# Patient Record
Sex: Male | Born: 1985 | Race: Black or African American | Hispanic: No | Marital: Single | Smoking: Never smoker
Health system: Southern US, Community
[De-identification: ages and names within clinical notes are randomized; demographics above are authoritative.]

---

## 2010-10-09 ENCOUNTER — Emergency Department: Payer: Self-pay | Admitting: Unknown Physician Specialty

## 2014-12-29 ENCOUNTER — Encounter (HOSPITAL_COMMUNITY): Payer: Self-pay

## 2014-12-29 ENCOUNTER — Emergency Department (HOSPITAL_COMMUNITY): Payer: Self-pay

## 2014-12-29 ENCOUNTER — Emergency Department (HOSPITAL_COMMUNITY)
Admission: EM | Admit: 2014-12-29 | Discharge: 2014-12-29 | Disposition: A | Discharge status: Home / Self Care | Payer: Self-pay | Attending: Emergency Medicine | Admitting: Emergency Medicine

## 2014-12-29 DIAGNOSIS — N390 Urinary tract infection, site not specified: Secondary | ICD-10-CM | POA: Insufficient documentation

## 2014-12-29 DIAGNOSIS — R809 Proteinuria, unspecified: Secondary | ICD-10-CM | POA: Insufficient documentation

## 2014-12-29 DIAGNOSIS — Z87891 Personal history of nicotine dependence: Secondary | ICD-10-CM | POA: Insufficient documentation

## 2014-12-29 DIAGNOSIS — R197 Diarrhea, unspecified: Secondary | ICD-10-CM | POA: Insufficient documentation

## 2014-12-29 DIAGNOSIS — N200 Calculus of kidney: Secondary | ICD-10-CM | POA: Insufficient documentation

## 2014-12-29 LAB — URINE MICROSCOPIC-ADD ON

## 2014-12-29 LAB — URINALYSIS, ROUTINE W REFLEX MICROSCOPIC
Bilirubin Urine: NEGATIVE
GLUCOSE, UA: NEGATIVE mg/dL
Ketones, ur: NEGATIVE mg/dL
Nitrite: NEGATIVE
PH: 6 (ref 5.0–8.0)
Protein, ur: 30 mg/dL — AB
SPECIFIC GRAVITY, URINE: 1.024 (ref 1.005–1.030)
Urobilinogen, UA: 1 mg/dL (ref 0.0–1.0)

## 2014-12-29 LAB — CBC
HCT: 40.8 % (ref 39.0–52.0)
Hemoglobin: 13.9 g/dL (ref 13.0–17.0)
MCH: 29.6 pg (ref 26.0–34.0)
MCHC: 34.1 g/dL (ref 30.0–36.0)
MCV: 86.8 fL (ref 78.0–100.0)
Platelets: 161 K/uL (ref 150–400)
RBC: 4.7 MIL/uL (ref 4.22–5.81)
RDW: 12.3 % (ref 11.5–15.5)
WBC: 4.7 K/uL (ref 4.0–10.5)

## 2014-12-29 LAB — COMPREHENSIVE METABOLIC PANEL
ALBUMIN: 4.1 g/dL (ref 3.5–5.0)
ALK PHOS: 60 U/L (ref 38–126)
ALT: 40 U/L (ref 17–63)
ANION GAP: 8 (ref 5–15)
AST: 27 U/L (ref 15–41)
BUN: 13 mg/dL (ref 6–20)
CALCIUM: 9.1 mg/dL (ref 8.9–10.3)
CO2: 26 mmol/L (ref 22–32)
Chloride: 108 mmol/L (ref 101–111)
Creatinine, Ser: 1.44 mg/dL — ABNORMAL HIGH (ref 0.61–1.24)
GFR calc Af Amer: >60 mL/min (ref >60)
GFR calc non Af Amer: >60 mL/min (ref >60)
GLUCOSE: 112 mg/dL — AB (ref 65–99)
Potassium: 3.7 mmol/L (ref 3.5–5.1)
SODIUM: 142 mmol/L (ref 135–145)
Total Bilirubin: 0.4 mg/dL (ref 0.3–1.2)
Total Protein: 7.2 g/dL (ref 6.5–8.1)

## 2014-12-29 LAB — LIPASE, BLOOD: Lipase: 32 U/L (ref 22–51)

## 2014-12-29 MED ORDER — OXYCODONE-ACETAMINOPHEN 7.5-325 MG PO TABS: 1.0000 | ORAL_TABLET | ORAL | Status: AC | PRN

## 2014-12-29 MED ORDER — SODIUM CHLORIDE 0.9 % IV SOLN
INTRAVENOUS | Status: DC
  Administered 2014-12-29: 08:00:00 via INTRAVENOUS

## 2014-12-29 MED ORDER — ONDANSETRON HCL 4 MG/2ML IJ SOLN: 4.0000 mg | Freq: Once | INTRAMUSCULAR | Status: DC | PRN

## 2014-12-29 MED ORDER — TAMSULOSIN HCL 0.4 MG PO CAPS: 0.4000 mg | ORAL_CAPSULE | Freq: Every day | ORAL | Status: AC

## 2014-12-29 MED ORDER — FENTANYL CITRATE (PF) 100 MCG/2ML IJ SOLN
50.0000 ug | Freq: Once | INTRAMUSCULAR | Status: AC
  Administered 2014-12-29: 50 ug via INTRAVENOUS
  Filled 2014-12-29: qty 2

## 2014-12-29 MED ORDER — CEPHALEXIN 500 MG PO CAPS: 500.0000 mg | ORAL_CAPSULE | Freq: Four times a day (QID) | ORAL | Status: AC

## 2014-12-29 MED ORDER — HYDROMORPHONE HCL 1 MG/ML IJ SOLN
1.0000 mg | Freq: Once | INTRAMUSCULAR | Status: AC
  Administered 2014-12-29: 1 mg via INTRAVENOUS
  Filled 2014-12-29: qty 1

## 2014-12-29 MED ORDER — ONDANSETRON HCL 4 MG/2ML IJ SOLN
4.0000 mg | Freq: Once | INTRAMUSCULAR | Status: AC
  Administered 2014-12-29: 4 mg via INTRAVENOUS
  Filled 2014-12-29: qty 2

## 2014-12-29 NOTE — ED Notes (Signed)
Pt transported to CT with Rad Tech.

## 2014-12-29 NOTE — ED Notes (Signed)
Patient reports loer left abdominal pain since yesterday AM, states intermittent until 0530 this AM, states pain is now constant and sharp.  Last BM yesterday and WNL's for patient.  Patient denies nausea and vomiting

## 2014-12-29 NOTE — Discharge Instructions (Signed)
Proteinuria Proteinuria is a condition in which urine contains more protein than is normal. Proteinuria is either a sign that your body is producing too much protein or a sign that there is a problem with the kidneys. Healthy kidneys prevent most substances that the body needs, including proteins, from leaving the bloodstream and ending up in urine. CAUSES  Proteinuria may be caused by a temporary event or condition such as stress, exercise, or fever, and go away on its own. Proteinuria may also be a symptom of a more serious condition or disease. Causes of proteinuria include:  A kidney disease caused by:  Diabetes.  High blood pressure (hypertension).   A disease that affects the immune system, such as lupus.  A genetic disease, such as Alport's syndrome.  Medicines that damage the kidneys, such as long-term nonsteroidal anti-inflammatory drugs (NSAIDs).  Poisoning or exposure to toxic substances.  A reoccurring kidney or urinary infection.  Excess protein production in the body caused by:  Multiple myeloma.  Amyloidosis. SYMPTOMS You may have proteinuria without having noticeable symptoms. If there is a large amount of protein in your urine, your urine may look foamy. You may also notice swelling (edema) in your hands, feet, abdomen, or face. DIAGNOSIS To determine whether you have proteinuria, you will need to provide a urine sample. Your urine will then be tested for too much protein and the main blood protein albumin. If your test shows that you have proteinuria, you may need to take additional tests to determine its cause, how much protein is in your urine, and what type of protein is being lost. Tests may include:  Blood tests.  Urine tests.  A blood pressure measurement.  Imaging tests. TREATMENT  Treatment will depend on the cause of your proteinuria. Your caregiver will discuss treatment options with you after you have been diagnosed. If your proteinuria is mild or  temporary, no treatment may be necessary. HOME CARE INSTRUCTIONS Ask your caregiver if monitoring the level of protein in your urine at home using simple testing strips is appropriate for you. Early detection of proteinuria can lead to early and often successful treatment of the condition causing it. Document Released: 06/06/2005 Document Revised: 01/09/2012 Document Reviewed: 09/14/2011 [facility] Patient Information 2015 [place], [place]. This information is not intended to replace advice given to you by your health care provider. Make sure you discuss any questions you have with your health care provider. Kidney Stones Kidney stones (urolithiasis) are deposits that form inside your kidneys. The intense pain is caused by the stone moving through the urinary tract. When the stone moves, the ureter goes into spasm around the stone. The stone is usually passed in the urine.  CAUSES   A disorder that makes certain neck glands produce too much parathyroid hormone (primary hyperparathyroidism).  A buildup of uric acid crystals, similar to gout in your joints.  Narrowing (stricture) of the ureter.  A kidney obstruction present at birth (congenital obstruction).  Previous surgery on the kidney or ureters.  Numerous kidney infections. SYMPTOMS   Feeling sick to your stomach (nauseous).  Throwing up (vomiting).  Blood in the urine (hematuria).  Pain that usually spreads (radiates) to the groin.  Frequency or urgency of urination. DIAGNOSIS   Taking a history and physical exam.  Blood or urine tests.  CT scan.  Occasionally, an examination of the inside of the urinary bladder (cystoscopy) is performed. TREATMENT   Observation.  Increasing your fluid intake.  Extracorporeal shock wave lithotripsy--This is a  noninvasive procedure that uses shock waves to break up kidney stones.  Surgery may be needed if you have severe pain or persistent obstruction. There are various surgical  procedures. Most of the procedures are performed with the use of small instruments. Only small incisions are needed to accommodate these instruments, so recovery time is minimized. The size, location, and chemical composition are all important variables that will determine the proper choice of action for you. Talk to your health care provider to better understand your situation so that you will minimize the risk of injury to yourself and your kidney.  HOME CARE INSTRUCTIONS   Drink enough water and fluids to keep your urine clear or pale yellow. This will help you to pass the stone or stone fragments.  Strain all urine through the provided strainer. Keep all particulate matter and stones for your health care provider to see. The stone causing the pain may be as small as a grain of salt. It is very important to use the strainer each and every time you pass your urine. The collection of your stone will allow your health care provider to analyze it and verify that a stone has actually passed. The stone analysis will often identify what you can do to reduce the incidence of recurrences.  Only take over-the-counter or prescription medicines for pain, discomfort, or fever as directed by your health care provider.  Make a follow-up appointment with your health care provider as directed.  Get follow-up X-rays if required. The absence of pain does not always mean that the stone has passed. It may have only stopped moving. If the urine remains completely obstructed, it can cause loss of kidney function or even complete destruction of the kidney. It is your responsibility to make sure X-rays and follow-ups are completed. Ultrasounds of the kidney can show blockages and the status of the kidney. Ultrasounds are not associated with any radiation and can be performed easily in a matter of minutes. SEEK MEDICAL CARE IF:  You experience pain that is progressive and unresponsive to any pain medicine you have been  prescribed. SEEK IMMEDIATE MEDICAL CARE IF:   Pain cannot be controlled with the prescribed medicine.  You have a fever or shaking chills.  The severity or intensity of pain increases over 18 hours and is not relieved by pain medicine.  You develop a new onset of abdominal pain.  You feel faint or pass out.  You are unable to urinate. MAKE SURE YOU:   Understand these instructions.  Will watch your condition.  Will get help right away if you are not doing well or get worse. Document Released: 04/16/2005 Document Revised: 12/17/2012 Document Reviewed: 09/17/2012 [facility] Patient Information 2015 [place], [place]. This information is not intended to replace advice given to you by your health care provider. Make sure you discuss any questions you have with your health care provider. Urinary Tract Infection Urinary tract infections (UTIs) can develop anywhere along your urinary tract. Your urinary tract is your body's drainage system for removing wastes and extra water. Your urinary tract includes two kidneys, two ureters, a bladder, and a urethra. Your kidneys are a pair of bean-shaped organs. Each kidney is about the size of your fist. They are located below your ribs, one on each side of your spine. CAUSES Infections are caused by microbes, which are microscopic organisms, including fungi, viruses, and bacteria. These organisms are so small that they can only be seen through a microscope. Bacteria are the microbes that  most commonly cause UTIs. SYMPTOMS  Symptoms of UTIs may vary by age and gender of the patient and by the location of the infection. Symptoms in young women typically include a frequent and intense urge to urinate and a painful, burning feeling in the bladder or urethra during urination. Older women and men are more likely to be tired, shaky, and weak and have muscle aches and abdominal pain. A fever may mean the infection is in your kidneys. Other symptoms of a kidney  infection include pain in your back or sides below the ribs, nausea, and vomiting. DIAGNOSIS To diagnose a UTI, your caregiver will ask you about your symptoms. Your caregiver also will ask to provide a urine sample. The urine sample will be tested for bacteria and white blood cells. White blood cells are made by your body to help fight infection. TREATMENT  Typically, UTIs can be treated with medication. Because most UTIs are caused by a bacterial infection, they usually can be treated with the use of antibiotics. The choice of antibiotic and length of treatment depend on your symptoms and the type of bacteria causing your infection. HOME CARE INSTRUCTIONS  If you were prescribed antibiotics, take them exactly as your caregiver instructs you. Finish the medication even if you feel better after you have only taken some of the medication.  Drink enough water and fluids to keep your urine clear or pale yellow.  Avoid caffeine, tea, and carbonated beverages. They tend to irritate your bladder.  Empty your bladder often. Avoid holding urine for long periods of time.  Empty your bladder before and after sexual intercourse.  After a bowel movement, women should cleanse from front to back. Use each tissue only once. SEEK MEDICAL CARE IF:   You have back pain.  You develop a fever.  Your symptoms do not begin to resolve within 3 days. SEEK IMMEDIATE MEDICAL CARE IF:   You have severe back pain or lower abdominal pain.  You develop chills.  You have nausea or vomiting.  You have continued burning or discomfort with urination. MAKE SURE YOU:   Understand these instructions.  Will watch your condition.  Will get help right away if you are not doing well or get worse. Document Released: 01/24/2005 Document Revised: 10/16/2011 Document Reviewed: 05/25/2011 [facility] Patient Information 2015 [place], [place]. This information is not intended to replace advice given to you by your health  care provider. Make sure you discuss any questions you have with your health care provider.

## 2014-12-29 NOTE — ED Provider Notes (Signed)
CSN: [id number]     Arrival date & time 12/29/14  0650 History   First MD Initiated Contact with Patient 12/29/14 0715     Chief Complaint  Patient presents with  . Abdominal Pain     (Consider location/radiation/quality/duration/timing/severity/associated sxs/prior Treatment) Patient is a 29 y.o. male presenting with abdominal pain. The history is provided by the patient.  Abdominal Pain Pain location:  L flank Pain quality: sharp   Pain radiates to:  LLQ Pain severity:  Moderate Onset quality:  Sudden Duration:  1 day Timing:  Intermittent Progression:  Worsening Chronicity:  New Relieved by:  Nothing Worsened by:  Nothing tried Ineffective treatments:  None tried Associated symptoms: diarrhea, nausea and vomiting   Associated symptoms: no fever     History reviewed. No pertinent past medical history. History reviewed. No pertinent past surgical history. No family history on file. Social History  Substance Use Topics  . Smoking status: Former Games developer  . Smokeless tobacco: None  . Alcohol Use: Yes     Comment: once a month    Review of Systems  Constitutional: Negative for fever.  Gastrointestinal: Positive for nausea, vomiting, abdominal pain and diarrhea.  All other systems reviewed and are negative.     Allergies  Review of patient's allergies indicates no known allergies.  Home Medications   Prior to Admission medications   Not on File   BP 148/76 mmHg  Pulse 57  Temp(Src) 98 F (36.7 C) (Oral)  Resp 20  Ht  (1.854 m)  Wt 220 lb (99.791 kg)  BMI 29.03 kg/m2  SpO2 100% Physical Exam  Constitutional: He is oriented to person, place, and time. He appears well-developed and well-nourished.  Non-toxic appearance. No distress.  HENT:  Head: Normocephalic and atraumatic.  Eyes: Conjunctivae, EOM and lids are normal. Pupils are equal, round, and reactive to light.  Neck: Normal range of motion. Neck supple. No tracheal deviation present. No  thyroid mass present.  Cardiovascular: Normal rate, regular rhythm and normal heart sounds.  Exam reveals no gallop.   No murmur heard. Pulmonary/Chest: Effort normal and breath sounds normal. No stridor. No respiratory distress. He has no decreased breath sounds. He has no wheezes. He has no rhonchi. He has no rales.  Abdominal: Soft. Normal appearance and bowel sounds are normal. He exhibits no distension. There is no tenderness. There is no rebound and no CVA tenderness.    Musculoskeletal: Normal range of motion. He exhibits no edema or tenderness.  Neurological: He is alert and oriented to person, place, and time. He has normal strength. No cranial nerve deficit or sensory deficit. GCS eye subscore is 4. GCS verbal subscore is 5. GCS motor subscore is 6.  Skin: Skin is warm and dry. No abrasion and no rash noted.  Psychiatric: He has a normal mood and affect. His speech is normal and behavior is normal.  Nursing note and vitals reviewed.   ED Course  Procedures (including critical care time) Labs Review Labs Reviewed  CBC  LIPASE, BLOOD  COMPREHENSIVE METABOLIC PANEL  URINALYSIS, ROUTINE W REFLEX MICROSCOPIC (NOT AT [facility])    Imaging Review No results found. I have personally reviewed and evaluated these images and lab results as part of my medical decision-making.   EKG Interpretation None      MDM   Final diagnoses:  None    Patient given pain medication here feels better. Patient's elevated creatinine noted and likely from dehydration. Results of CT scan discussed with him  and will be given referral to urology. He was instructed to take more liquids. Blood pressure noted and patient will be informed that he is to follow-up with his doctor for that as he has protein in his urine. Patient also with 3-6 white cells and will place on antibiotics    Lorre Nick, MD 12/29/14 [phone]

## 2014-12-29 NOTE — ED Notes (Signed)
Pt. Unable to urinate at this time. Pt, aware of giving a urine specimen. Nurse was notified.

## 2017-06-24 ENCOUNTER — Emergency Department (HOSPITAL_COMMUNITY)
Admission: EM | Admit: 2017-06-24 | Discharge: 2017-06-24 | Disposition: A | Discharge status: Home / Self Care | Payer: Self-pay | Attending: Emergency Medicine | Admitting: Emergency Medicine

## 2017-06-24 ENCOUNTER — Encounter (HOSPITAL_COMMUNITY): Payer: Self-pay

## 2017-06-24 ENCOUNTER — Other Ambulatory Visit: Payer: Self-pay

## 2017-06-24 ENCOUNTER — Emergency Department (HOSPITAL_BASED_OUTPATIENT_CLINIC_OR_DEPARTMENT_OTHER): Admit: 2017-06-24 | Discharge: 2017-06-24 | Disposition: A | Discharge status: Home / Self Care | Payer: Self-pay

## 2017-06-24 DIAGNOSIS — M7989 Other specified soft tissue disorders: Secondary | ICD-10-CM

## 2017-06-24 DIAGNOSIS — M79601 Pain in right arm: Secondary | ICD-10-CM

## 2017-06-24 DIAGNOSIS — R2231 Localized swelling, mass and lump, right upper limb: Secondary | ICD-10-CM | POA: Insufficient documentation

## 2017-06-24 DIAGNOSIS — Z79899 Other long term (current) drug therapy: Secondary | ICD-10-CM | POA: Insufficient documentation

## 2017-06-24 DIAGNOSIS — M79609 Pain in unspecified limb: Secondary | ICD-10-CM

## 2017-06-24 DIAGNOSIS — Z87891 Personal history of nicotine dependence: Secondary | ICD-10-CM | POA: Insufficient documentation

## 2017-06-24 NOTE — Progress Notes (Signed)
Preliminary notes by tech--Rt upper extremity venous duplex exam completed. Negative for DVT.  Hongying Paxten Appelt(RDMS, RVT)

## 2017-06-24 NOTE — ED Provider Notes (Signed)
[facility] Note   CSN: [id number] Arrival date & time: 06/24/17  0808     History   Chief Complaint Chief Complaint  Patient presents with  . Arm pain    HPI Jonathon DeitersJoshua A Hoadley is a 32 y.o. male.  HPI 32 year old male presents emergency department because of swelling and bruising of his right antecubital fossa in the right upper extremity.  He states he donated plasma 1 week ago and has been having swelling and pain there since.  No history of DVT.  No chest pain or shortness of breath.  Denies numbness or weakness of his right upper extremity.  He states he has difficulty straightening his right arm all the way secondary to the bruising and swelling.  Symptoms are moderate in severity.   History reviewed. No pertinent past medical history.  There are no active problems to display for this patient.   History reviewed. No pertinent surgical history.     Home Medications    Prior to Admission medications   Medication Sig Start Date End Date Taking? Authorizing Provider  aspirin-acetaminophen-caffeine (EXCEDRIN MIGRAINE) 940 [id number] [id number] MG per tablet Take 2 tablets by mouth every 6 (six) hours as needed for headache or migraine.    [provider]  cephALEXin (KEFLEX) 500 MG capsule Take 1 capsule (500 mg total) by mouth 4 (four) times daily. 12/29/14   Lorre NickAllen, Anthony, MD  oxyCODONE-acetaminophen (PERCOCET) 7.5-325 MG per tablet Take 1 tablet by mouth every 4 (four) hours as needed for severe pain. 12/29/14   Lorre NickAllen, Anthony, MD  tamsulosin (FLOMAX) 0.4 MG CAPS capsule Take 1 capsule (0.4 mg total) by mouth daily. 12/29/14   Lorre NickAllen, Anthony, MD    Family History No family history on file.  Social History Social History   Tobacco Use  . Smoking status: Former Smoker  Substance Use Topics  . Alcohol use: Yes    Comment: once a month  . Drug use: No     Allergies   Patient has no known allergies.   Review of  Systems Review of Systems  All other systems reviewed and are negative.    Physical Exam Updated Vital Signs BP 118/69   Pulse 75   Temp 98.4 F (36.9 C) (Oral)   Resp 17   Ht 6\' 1"  (1.854 m)   Wt 88 kg (194 lb)   SpO2 98%   BMI 25.60 kg/m   Physical Exam  Constitutional: He is oriented to person, place, and time. He appears well-developed and well-nourished.  HENT:  Head: Normocephalic.  Eyes: EOM are normal.  Neck: Normal range of motion.  Pulmonary/Chest: Effort normal.  Abdominal: He exhibits no distension.  Musculoskeletal:  Mild swelling of the right upper extremity as compared to the left.  Normal grip right hand.  Normal right radial pulse.  Bruising on the medial aspect of the proximal right forearm and distal right upper arm.  Normal pronation and supination.  No erythema or warmth  Neurological: He is alert and oriented to person, place, and time.  Psychiatric: He has a normal mood and affect.  Nursing note and vitals reviewed.    ED Treatments / Results  Labs (all labs ordered are listed, but only abnormal results are displayed) Labs Reviewed - No data to display  EKG  EKG Interpretation None       Radiology No results found.  Procedures Procedures (including critical care time)  Medications Ordered in ED Medications - No data to display  Initial Impression / Assessment and Plan / ED Course  I have reviewed the triage vital signs and the nursing notes.  Pertinent labs & imaging results that were available during my care of the patient were reviewed by me and considered in my medical decision making (see chart for details).     Venous duplex negative for DVT.  Normal right radial pulse.  Likely bruising associated with some minor venous extravasation.  Overall well-appearing.  Discharged home in good condition.  Compartments are soft.  Final Clinical Impressions(s) / ED Diagnoses   Final diagnoses:  Pain and swelling of right upper  extremity    ED Discharge Orders    None       Azalia Bilis, MD 06/24/17 [phone]

## 2017-06-24 NOTE — Discharge Instructions (Signed)
Elevate  your arm.

## 2017-06-24 NOTE — ED Triage Notes (Signed)
Pt presents to the ed with bruising and tenderness to right arm after donating plasma one week ago.  Pt reports it is getting worse, cms intact.

## 2017-07-11 ENCOUNTER — Encounter (HOSPITAL_COMMUNITY): Payer: Self-pay | Admitting: Emergency Medicine

## 2017-07-11 ENCOUNTER — Emergency Department (HOSPITAL_COMMUNITY): Payer: Self-pay

## 2017-07-11 ENCOUNTER — Other Ambulatory Visit: Payer: Self-pay

## 2017-07-11 ENCOUNTER — Emergency Department (HOSPITAL_COMMUNITY)
Admission: EM | Admit: 2017-07-11 | Discharge: 2017-07-11 | Disposition: A | Discharge status: Home / Self Care | Payer: Self-pay | Attending: Emergency Medicine | Admitting: Emergency Medicine

## 2017-07-11 DIAGNOSIS — J3089 Other allergic rhinitis: Secondary | ICD-10-CM | POA: Insufficient documentation

## 2017-07-11 DIAGNOSIS — Z87891 Personal history of nicotine dependence: Secondary | ICD-10-CM | POA: Insufficient documentation

## 2017-07-11 DIAGNOSIS — Z7982 Long term (current) use of aspirin: Secondary | ICD-10-CM | POA: Insufficient documentation

## 2017-07-11 DIAGNOSIS — R062 Wheezing: Secondary | ICD-10-CM | POA: Insufficient documentation

## 2017-07-11 MED ORDER — ALBUTEROL SULFATE HFA 108 (90 BASE) MCG/ACT IN AERS
2.0000 | INHALATION_SPRAY | Freq: Once | RESPIRATORY_TRACT | Status: AC
Start: 2017-07-11 — End: 2017-07-11
  Administered 2017-07-11: 2 via RESPIRATORY_TRACT
  Filled 2017-07-11: qty 6.7

## 2017-07-11 MED ORDER — CETIRIZINE-PSEUDOEPHEDRINE ER 5-120 MG PO TB12: 1.0000 | ORAL_TABLET | Freq: Two times a day (BID) | ORAL | 0 refills | Status: AC

## 2017-07-11 NOTE — ED Triage Notes (Signed)
Pt c/o of cough and congestion and pain "in lining of stomach".  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

## 2017-07-11 NOTE — Discharge Instructions (Signed)
As discussed I suspect you are having allergy very possibly to your new work enviroment.  You have been prescribed an allergy medicine (although you can buy this over the counter).  Also, use the albuterol inhaler as instructed if you are coughing or wheezing.

## 2017-07-11 NOTE — ED Provider Notes (Signed)
[facility] EMERGENCY DEPARTMENT Provider Note   CSN: [id number] Arrival date & time: 07/11/17  1538     History   Chief Complaint Chief Complaint  Patient presents with  . Cough    HPI Jonathon Bolton is a 32 y.o. male presenting with a one week history of nasal congestion, clear rhinorrhea and cough productive of clear sputum.  He started a new job last week where is lots of floating cotton particles in the air which he believes he is reacting to as he also reports itchy, watery eyes and sneezing.  He wears a mask but not consistently and reports frequently toward the end of his shift he has increased chest tightness and wheezing with sob. He has had no medications prior to arrival.  HPI  History reviewed. No pertinent past medical history.  There are no active problems to display for this patient.   History reviewed. No pertinent surgical history.     Home Medications    Prior to Admission medications   Medication Sig Start Date End Date Taking? Authorizing Provider  aspirin-acetaminophen-caffeine (EXCEDRIN MIGRAINE) [phone] MG per tablet Take 2 tablets by mouth every 6 (six) hours as needed for headache or migraine.    [provider]  cephALEXin (KEFLEX) 500 MG capsule Take 1 capsule (500 mg total) by mouth 4 (four) times daily. 12/29/14   Lorre Nick, MD  cetirizine-pseudoephedrine (ZYRTEC-D) 5-120 MG tablet Take 1 tablet by mouth 2 (two) times daily. 07/11/17   Burgess Amor, PA-C  oxyCODONE-acetaminophen (PERCOCET) 7.5-325 MG per tablet Take 1 tablet by mouth every 4 (four) hours as needed for severe pain. 12/29/14   Lorre Nick, MD  tamsulosin (FLOMAX) 0.4 MG CAPS capsule Take 1 capsule (0.4 mg total) by mouth daily. 12/29/14   Lorre Nick, MD    Family History History reviewed. No pertinent family history.  Social History Social History   Tobacco Use  . Smoking status: Former Games developer  . Smokeless tobacco: Never Used  Substance Use Topics  .  Alcohol use: Yes    Comment: once a month  . Drug use: Yes    Types: Marijuana    Comment: 2 days ago     Allergies   Patient has no known allergies.   Review of Systems Review of Systems  Constitutional: Negative for chills and fever.  HENT: Positive for congestion, postnasal drip, rhinorrhea and sneezing. Negative for ear pain, sinus pressure, sinus pain, sore throat, trouble swallowing and voice change.   Eyes: Negative for discharge.  Respiratory: Positive for cough, chest tightness and wheezing. Negative for shortness of breath and stridor.   Cardiovascular: Negative for chest pain.  Gastrointestinal: Negative for abdominal pain.  Genitourinary: Negative.      Physical Exam Updated Vital Signs BP 119/72 (BP Location: Left Arm)   Pulse 78   Temp 98 F (36.7 C) (Oral)   Resp 16   Ht 6' (1.829 m)   Wt 83.5 kg (184 lb)   SpO2 100%   BMI 24.95 kg/m   Physical Exam  Constitutional: He is oriented to person, place, and time. He appears well-developed and well-nourished.  HENT:  Head: Normocephalic and atraumatic.  Right Ear: Tympanic membrane and ear canal normal.  Left Ear: Tympanic membrane and ear canal normal.  Nose: Rhinorrhea present. No mucosal edema.  Mouth/Throat: Uvula is midline, oropharynx is clear and moist and mucous membranes are normal. No oropharyngeal exudate, posterior oropharyngeal edema, posterior oropharyngeal erythema or tonsillar abscesses.  Eyes: Conjunctivae are  normal.  Cardiovascular: Normal rate and normal heart sounds.  Pulmonary/Chest: Effort normal and breath sounds normal. No respiratory distress. He has no decreased breath sounds. He has no wheezes. He has no rhonchi. He has no rales.  No wheezing at present, lungs ctab  Musculoskeletal: Normal range of motion.  Neurological: He is alert and oriented to person, place, and time.  Skin: Skin is warm and dry. No rash noted.  Psychiatric: He has a normal mood and affect.     ED  Treatments / Results  Labs (all labs ordered are listed, but only abnormal results are displayed) Labs Reviewed - No data to display  EKG  EKG Interpretation None       Radiology Dg Chest 2 View  Result Date: 07/11/2017 CLINICAL DATA:  Cough EXAM: CHEST - 2 VIEW COMPARISON:  None. FINDINGS: Normal heart size. Normal mediastinal contour. No pneumothorax. No pleural effusion. Lungs appear clear, with no acute consolidative airspace disease and no pulmonary edema. IMPRESSION: No active cardiopulmonary disease. Electronically Signed   By: Delbert PhenixJason A Poff M.D.   On: 07/11/2017 16:09    Procedures Procedures (including critical care time)  Medications Ordered in ED Medications  albuterol (PROVENTIL HFA;VENTOLIN HFA) 108 (90 Base) MCG/ACT inhaler 2 puff (not administered)     Initial Impression / Assessment and Plan / ED Course  I have reviewed the triage vital signs and the nursing notes.  Pertinent labs & imaging results that were available during my care of the patient were reviewed by me and considered in my medical decision making (see chart for details).     Pt with history suggesting allergic reaction with wheezing, but is currently asymptomatic.  He was placed on zyrtec, albuterol given.  Referrals given to establish primary care.  Advised he should wear mask at all times at work to avoid inhaling allergen.  Final Clinical Impressions(s) / ED Diagnoses   Final diagnoses:  Allergic rhinitis due to other allergic trigger, unspecified seasonality  Wheezing    ED Discharge Orders        Ordered    cetirizine-pseudoephedrine (ZYRTEC-D) 5-120 MG tablet  2 times daily     07/11/17 1702       Burgess Amordol, Nyheim Seufert, PA-C 07/11/17 1709    Samuel JesterMcManus, Kathleen, DO 07/11/17 2319

## 2019-06-24 ENCOUNTER — Encounter (HOSPITAL_BASED_OUTPATIENT_CLINIC_OR_DEPARTMENT_OTHER): Payer: Self-pay | Admitting: Emergency Medicine

## 2019-06-24 ENCOUNTER — Emergency Department (HOSPITAL_BASED_OUTPATIENT_CLINIC_OR_DEPARTMENT_OTHER): Payer: Self-pay

## 2019-06-24 ENCOUNTER — Other Ambulatory Visit: Payer: Self-pay

## 2019-06-24 ENCOUNTER — Emergency Department (HOSPITAL_BASED_OUTPATIENT_CLINIC_OR_DEPARTMENT_OTHER)
Admission: EM | Admit: 2019-06-24 | Discharge: 2019-06-24 | Disposition: A | Discharge status: Home / Self Care | Payer: Self-pay | Attending: Emergency Medicine | Admitting: Emergency Medicine

## 2019-06-24 DIAGNOSIS — Z20822 Contact with and (suspected) exposure to covid-19: Secondary | ICD-10-CM | POA: Insufficient documentation

## 2019-06-24 DIAGNOSIS — Z79899 Other long term (current) drug therapy: Secondary | ICD-10-CM | POA: Insufficient documentation

## 2019-06-24 DIAGNOSIS — R0602 Shortness of breath: Secondary | ICD-10-CM | POA: Insufficient documentation

## 2019-06-24 DIAGNOSIS — Z87891 Personal history of nicotine dependence: Secondary | ICD-10-CM | POA: Insufficient documentation

## 2019-06-24 DIAGNOSIS — B349 Viral infection, unspecified: Secondary | ICD-10-CM | POA: Insufficient documentation

## 2019-06-24 DIAGNOSIS — R0789 Other chest pain: Secondary | ICD-10-CM | POA: Insufficient documentation

## 2019-06-24 DIAGNOSIS — R070 Pain in throat: Secondary | ICD-10-CM | POA: Insufficient documentation

## 2019-06-24 LAB — CBC WITH DIFFERENTIAL/PLATELET
Abs Immature Granulocytes: 0.01 10*3/uL (ref 0.00–0.07)
Basophils Absolute: 0 10*3/uL (ref 0.0–0.1)
Basophils Relative: 1 %
Eosinophils Absolute: 0.2 10*3/uL (ref 0.0–0.5)
Eosinophils Relative: 4 %
HCT: 45.2 % (ref 39.0–52.0)
Hemoglobin: 14.9 g/dL (ref 13.0–17.0)
Immature Granulocytes: 0 %
Lymphocytes Relative: 44 %
Lymphs Abs: 1.8 10*3/uL (ref 0.7–4.0)
MCH: 30.2 pg (ref 26.0–34.0)
MCHC: 33 g/dL (ref 30.0–36.0)
MCV: 91.5 fL (ref 80.0–100.0)
Monocytes Absolute: 0.5 10*3/uL (ref 0.1–1.0)
Monocytes Relative: 11 %
Neutro Abs: 1.6 10*3/uL — ABNORMAL LOW (ref 1.7–7.7)
Neutrophils Relative %: 40 %
Platelets: 167 10*3/uL (ref 150–400)
RBC: 4.94 MIL/uL (ref 4.22–5.81)
RDW: 11.9 % (ref 11.5–15.5)
WBC: 4 10*3/uL (ref 4.0–10.5)
nRBC: 0 % (ref 0.0–0.2)

## 2019-06-24 LAB — COMPREHENSIVE METABOLIC PANEL
ALT: 37 U/L (ref 0–44)
AST: 31 U/L (ref 15–41)
Albumin: 3.9 g/dL (ref 3.5–5.0)
Alkaline Phosphatase: 52 U/L (ref 38–126)
Anion gap: 5 (ref 5–15)
BUN: 16 mg/dL (ref 6–20)
CO2: 26 mmol/L (ref 22–32)
Calcium: 8.3 mg/dL — ABNORMAL LOW (ref 8.9–10.3)
Chloride: 109 mmol/L (ref 98–111)
Creatinine, Ser: 1.34 mg/dL — ABNORMAL HIGH (ref 0.61–1.24)
GFR calc Af Amer: >60 mL/min (ref >60)
GFR calc non Af Amer: >60 mL/min (ref >60)
Glucose, Bld: 97 mg/dL (ref 70–99)
Potassium: 4.7 mmol/L (ref 3.5–5.1)
Sodium: 140 mmol/L (ref 135–145)
Total Bilirubin: 0.4 mg/dL (ref 0.3–1.2)
Total Protein: 6.7 g/dL (ref 6.5–8.1)

## 2019-06-24 LAB — URINALYSIS, ROUTINE W REFLEX MICROSCOPIC
Bilirubin Urine: NEGATIVE
Glucose, UA: NEGATIVE mg/dL
Hgb urine dipstick: NEGATIVE
Ketones, ur: NEGATIVE mg/dL
Leukocytes,Ua: NEGATIVE
Nitrite: NEGATIVE
Protein, ur: NEGATIVE mg/dL
Specific Gravity, Urine: 1.02 (ref 1.005–1.030)
pH: 7 (ref 5.0–8.0)

## 2019-06-24 LAB — RAPID URINE DRUG SCREEN, HOSP PERFORMED
Amphetamines: NOT DETECTED
Barbiturates: NOT DETECTED
Benzodiazepines: NOT DETECTED
Cocaine: NOT DETECTED
Opiates: NOT DETECTED
Tetrahydrocannabinol: POSITIVE — AB

## 2019-06-24 LAB — SARS CORONAVIRUS 2 (TAT 6-24 HRS): SARS Coronavirus 2: NEGATIVE

## 2019-06-24 LAB — HIV ANTIBODY (ROUTINE TESTING W REFLEX): HIV Screen 4th Generation wRfx: NONREACTIVE

## 2019-06-24 LAB — TROPONIN I (HIGH SENSITIVITY): Troponin I (High Sensitivity): 4 ng/L (ref <18)

## 2019-06-24 LAB — LIPASE, BLOOD: Lipase: 36 U/L (ref 11–51)

## 2019-06-24 LAB — C-REACTIVE PROTEIN: CRP: 0.6 mg/dL (ref <1.0)

## 2019-06-24 LAB — SEDIMENTATION RATE: Sed Rate: 0 mm/hr (ref 0–16)

## 2019-06-24 MED ORDER — BENZONATATE 100 MG PO CAPS: 100.0000 mg | ORAL_CAPSULE | Freq: Three times a day (TID) | ORAL | 0 refills | Status: AC

## 2019-06-24 MED ORDER — ONDANSETRON HCL 4 MG/2ML IJ SOLN
4.0000 mg | Freq: Once | INTRAMUSCULAR | Status: AC
  Administered 2019-06-24: 10:00:00 4 mg via INTRAVENOUS
  Filled 2019-06-24: qty 2

## 2019-06-24 MED ORDER — ONDANSETRON 4 MG PO TBDP: 4.0000 mg | ORAL_TABLET | Freq: Three times a day (TID) | ORAL | 0 refills | Status: DC | PRN

## 2019-06-24 MED ORDER — SODIUM CHLORIDE 0.9 % IV BOLUS
1000.0000 mL | Freq: Once | INTRAVENOUS | Status: AC
  Administered 2019-06-24: 1000 mL via INTRAVENOUS

## 2019-06-24 MED ORDER — DICYCLOMINE HCL 20 MG PO TABS: 20.0000 mg | ORAL_TABLET | Freq: Two times a day (BID) | ORAL | 0 refills | Status: AC

## 2019-06-24 MED ORDER — IBUPROFEN 600 MG PO TABS: 600.0000 mg | ORAL_TABLET | Freq: Three times a day (TID) | ORAL | 0 refills | Status: AC

## 2019-06-24 MED ORDER — KETOROLAC TROMETHAMINE 30 MG/ML IJ SOLN
15.0000 mg | Freq: Once | INTRAMUSCULAR | Status: AC
  Administered 2019-06-24: 15 mg via INTRAVENOUS
  Filled 2019-06-24: qty 1

## 2019-06-24 MED ORDER — FLUTICASONE PROPIONATE 50 MCG/ACT NA SUSP: 2.0000 | Freq: Every day | NASAL | 0 refills | Status: AC

## 2019-06-24 NOTE — ED Triage Notes (Signed)
Pt here with chest discomfort, SOB, sore throat, and abdominal pain x 1 week. Pt states he is concerned about COVID being the cause.

## 2019-06-24 NOTE — Discharge Instructions (Addendum)
General Viral Syndrome Care Instructions:  Your symptoms are likely consistent with a viral illness. Viruses do not require or respond to antibiotics. Treatment is symptomatic care and it is important to note that these symptoms may last for 7-14 days.   Hand washing: Wash your hands throughout the day, but especially before and after touching the face, using the restroom, sneezing, coughing, or touching surfaces that have been coughed or sneezed upon. Hydration: Symptoms of most illnesses will be intensified and complicated by dehydration. Dehydration can also extend the duration of symptoms. Drink plenty of fluids and get plenty of rest. You should be drinking at least half a liter of water an hour to stay hydrated. Electrolyte drinks (ex. Gatorade, Powerade, Pedialyte) are also encouraged. You should be drinking enough fluids to make your urine light yellow, almost clear. If this is not the case, you are not drinking enough water. Please note that some of the treatments indicated below will not be effective if you are not adequately hydrated. Diet: Please concentrate on hydration, however, you may introduce food slowly.  Start with a clear liquid diet, progressed to a full liquid diet, and then bland solids as you are able. Pain or fever: Ibuprofen, Naproxen, or acetaminophen (generic for Tylenol) for pain or fever.  Antiinflammatory medications: Take 600 mg of ibuprofen 3 times a day for the next week. After this time, this medication may be used as needed for pain. Take this medication with food to avoid upset stomach. Acetaminophen (generic for Tylenol): Should you continue to have additional pain while taking the ibuprofen or naproxen, you may add in acetaminophen as needed. Your daily total maximum amount of acetaminophen from all sources should be limited to 4000mg /day for persons without liver problems, or 2000mg /day for those with liver problems. Nausea/vomiting: Use the ondansetron (generic  for Zofran) for nausea or vomiting.  This medication may not prevent all vomiting or nausea, but can help facilitate better hydration. Things that can help with nausea/vomiting also include peppermint/menthol candies, vitamin B12, and ginger. Diarrhea: May use medications such as loperamide (Imodium) or Bismuth subsalicylate (Pepto-Bismol). Dicyclomine: Dicyclomine (generic for Bentyl) is what is known as an antispasmodic and is intended to help reduce abdominal discomfort. Cough: Use the benzonatate (generic for Tessalon) for cough.  Teas, warm liquids, broths, and honey can also help with cough. Albuterol: May use the albuterol as needed for instances of shortness of breath. Zyrtec or Claritin: May add these medication daily to control underlying symptoms of congestion, sneezing, and other signs of allergies.  These medications are available over-the-counter. Generics: Cetirizine (generic for Zyrtec) and loratadine (generic for Claritin). Fluticasone: Use fluticasone (generic for Flonase), as directed, for nasal and sinus congestion.  This medication is available over-the-counter. Congestion: Plain guaifenesin (generic for plain Mucinex) may help relieve congestion. Saline sinus rinses and saline nasal sprays may also help relieve congestion. If you do not have high blood pressure, heart problems, or an allergy to such medications, you may also try phenylephrine or Sudafed. Sore throat: Warm liquids or Chloraseptic spray may help soothe a sore throat. Gargle twice a day with a salt water solution made from a half teaspoon of salt in a cup of warm water.  Follow up: Follow up with a primary care provider within the next two weeks should symptoms fail to resolve. Return: Return to the ED for significantly worsening symptoms, shortness of breath, persistent vomiting, large amounts of blood in stool, or any other major concerns.  For prescription assistance,  may try using prescription discount sites or  apps, such as goodrx.com  Based on the EKG findings, you may have developed pericarditis.  Follow the instructions above for ibuprofen.  Follow-up with cardiology should symptoms fail to resolve.

## 2019-06-24 NOTE — ED Provider Notes (Signed)
[place] EMERGENCY DEPARTMENT Provider Note   CSN: [id number] Arrival date & time: 06/24/19  4287     History Chief Complaint  Patient presents with  . Abdominal Pain  . Sore Throat  . Chest Pain  . Shortness of Breath    Jonathon Bolton is a 34 y.o. male.  HPI      Jonathon Bolton is a 34 y.o. male, patient with no pertinent past medical history, presenting to the ED with concern for COVID-19 infection.  He has experienced almost a week of intermittent chest discomfort, abdominal discomfort, nausea, occasional nonproductive cough, occasional headache, and nasal congestion.  Nonbloody, nonbilious emesis 2 days ago, but none since. Abdominal discomfort is mostly in the epigastric region, intermittent, cramping, mild, comes on mostly with coughing or when he was vomiting. Chest pain is described as a soreness, across the chest, mild, no change with deep breathing or with exertion. He has had contact with somebody later diagnosed with COVID-19.  Denies fever/chills, shortness of breath, diarrhea, urinary symptoms, syncope, dizziness, neck stiffness, lower extremity edema/pain, or any other complaints.    History reviewed. No pertinent past medical history.  There are no problems to display for this patient.   History reviewed. No pertinent surgical history.     History reviewed. No pertinent family history.  Social History   Tobacco Use  . Smoking status: Former Research scientist (life sciences)  . Smokeless tobacco: Never Used  Substance Use Topics  . Alcohol use: Yes    Comment: once a month  . Drug use: Yes    Types: Marijuana    Comment: 2 days ago    Home Medications Prior to Admission medications   Medication Sig Start Date End Date Taking? Authorizing Provider  aspirin-acetaminophen-caffeine (EXCEDRIN MIGRAINE) [phone] MG per tablet Take 2 tablets by mouth every 6 (six) hours as needed for headache or migraine.    [provider]  benzonatate (TESSALON)  100 MG capsule Take 1 capsule (100 mg total) by mouth every 8 (eight) hours. 06/24/19   Alyce Inscore C, PA-C  cephALEXin (KEFLEX) 500 MG capsule Take 1 capsule (500 mg total) by mouth 4 (four) times daily. 12/29/14   Lacretia Leigh, MD  cetirizine-pseudoephedrine (ZYRTEC-D) 5-120 MG tablet Take 1 tablet by mouth 2 (two) times daily. 07/11/17   Evalee Jefferson, PA-C  dicyclomine (BENTYL) 20 MG tablet Take 1 tablet (20 mg total) by mouth 2 (two) times daily. 06/24/19   Jolyn Deshmukh C, PA-C  fluticasone (FLONASE) 50 MCG/ACT nasal spray Place 2 sprays into both nostrils daily. 06/24/19   Amare Bail C, PA-C  ibuprofen (ADVIL) 600 MG tablet Take 1 tablet (600 mg total) by mouth 3 (three) times daily for 7 days. 06/24/19 07/01/19  Emaya Preston C, PA-C  ondansetron (ZOFRAN ODT) 4 MG disintegrating tablet Take 1 tablet (4 mg total) by mouth every 8 (eight) hours as needed for nausea or vomiting. 06/24/19   Saverio Kader C, PA-C  oxyCODONE-acetaminophen (PERCOCET) 7.5-325 MG per tablet Take 1 tablet by mouth every 4 (four) hours as needed for severe pain. 12/29/14   Lacretia Leigh, MD  tamsulosin (FLOMAX) 0.4 MG CAPS capsule Take 1 capsule (0.4 mg total) by mouth daily. 12/29/14   Lacretia Leigh, MD    Allergies    Patient has no known allergies.  Review of Systems   Review of Systems  Constitutional: Negative for chills, diaphoresis and fever.  HENT: Positive for congestion. Negative for trouble swallowing and voice change.   Respiratory:  Positive for cough. Negative for shortness of breath.   Cardiovascular: Positive for chest pain. Negative for leg swelling.  Gastrointestinal: Positive for abdominal pain, nausea and vomiting. Negative for diarrhea.  Genitourinary: Negative for dysuria, flank pain and hematuria.  Musculoskeletal: Negative for back pain and neck pain.  Skin: Negative for rash.  Neurological: Positive for headaches. Negative for dizziness, syncope and weakness.  All other systems reviewed and are  negative.   Physical Exam Updated Vital Signs BP 117/87 (BP Location: Right Arm)   Pulse 78   Temp 98.6 F (37 C) (Oral)   Resp 20   SpO2 100%   Physical Exam Vitals and nursing note reviewed.  Constitutional:      General: He is not in acute distress.    Appearance: He is well-developed. He is not diaphoretic.  HENT:     Head: Normocephalic and atraumatic.     Mouth/Throat:     Mouth: Mucous membranes are moist.     Pharynx: Oropharynx is clear.  Eyes:     Conjunctiva/sclera: Conjunctivae normal.  Cardiovascular:     Rate and Rhythm: Normal rate and regular rhythm.     Pulses: Normal pulses.          Radial pulses are 2+ on the right side and 2+ on the left side.       Posterior tibial pulses are 2+ on the right side and 2+ on the left side.     Heart sounds: Normal heart sounds.     Comments: Tactile temperature in the extremities appropriate and equal bilaterally. No noted friction rub. Pulmonary:     Effort: Pulmonary effort is normal. No respiratory distress.     Breath sounds: Normal breath sounds.     Comments: No increased work of breathing.  Speaks in full sentences without difficulty. Abdominal:     Palpations: Abdomen is soft.     Tenderness: There is no guarding.     Comments: Patient verbally indicates mild abdominal tenderness in the epigastric region, but has no other reaction.  Musculoskeletal:     Cervical back: Neck supple.     Right lower leg: No edema.     Left lower leg: No edema.  Lymphadenopathy:     Cervical: No cervical adenopathy.  Skin:    General: Skin is warm and dry.  Neurological:     Mental Status: He is alert.  Psychiatric:        Mood and Affect: Mood and affect normal.        Speech: Speech normal.        Behavior: Behavior normal.     ED Results / Procedures / Treatments   Labs (all labs ordered are listed, but only abnormal results are displayed) Labs Reviewed  RAPID URINE DRUG SCREEN, HOSP PERFORMED - Abnormal; Notable  for the following components:      Result Value   Tetrahydrocannabinol POSITIVE (*)    All other components within normal limits  CBC WITH DIFFERENTIAL/PLATELET - Abnormal; Notable for the following components:   Neutro Abs 1.6 (*)    All other components within normal limits  COMPREHENSIVE METABOLIC PANEL - Abnormal; Notable for the following components:   Creatinine, Ser 1.34 (*)    Calcium 8.3 (*)    All other components within normal limits  SARS CORONAVIRUS 2 (TAT 6-24 HRS)  URINALYSIS, ROUTINE W REFLEX MICROSCOPIC  LIPASE, BLOOD  HIV ANTIBODY (ROUTINE TESTING W REFLEX)  SEDIMENTATION RATE  C-REACTIVE PROTEIN  RPR  GC/CHLAMYDIA  PROBE AMP () NOT AT [facility]  TROPONIN I (HIGH SENSITIVITY)   BUN  Date Value Ref Range Status  06/24/2019 16 6 - 20 mg/dL Final  12/29/2014 13 6 - 20 mg/dL Final   Creatinine, Ser  Date Value Ref Range Status  06/24/2019 1.34 (H) 0.61 - 1.24 mg/dL Final  12/29/2014 1.44 (H) 0.61 - 1.24 mg/dL Final     EKG EKG Interpretation  Date/Time:  Wednesday June 24 2019 09:42:51 EST Ventricular Rate:  76 PR Interval:    QRS Duration: 98 QT Interval:  371 QTC Calculation: 418 R Axis:   77 Text Interpretation: Sinus rhythm ST elevation suggests acute pericarditis Baseline wander in lead(s) II III aVR aVL aVF No old tracing to compare Confirmed by Deno Etienne [phone]) on 06/24/2019 9:47:35 AM   Radiology DG Chest Port 1 View  Result Date: 06/24/2019 CLINICAL DATA:  Shortness of breath with cough and chest pain EXAM: PORTABLE CHEST 1 VIEW COMPARISON:  July 11, 2017 FINDINGS: Lungs are clear. Heart size and pulmonary vascularity are normal. No adenopathy. No pneumothorax. No bone lesions. IMPRESSION: No abnormality noted. Electronically Signed   By: Lowella Grip III M.D.   On: 06/24/2019 10:36    Procedures Procedures (including critical care time)  Medications Ordered in ED Medications  sodium chloride 0.9 % bolus 1,000 mL (0 mLs  Intravenous Stopped 06/24/19 1228)  ondansetron (ZOFRAN) injection 4 mg (4 mg Intravenous Given 06/24/19 1013)  ketorolac (TORADOL) 30 MG/ML injection 15 mg (15 mg Intravenous Given 06/24/19 1013)    ED Course  I have reviewed the triage vital signs and the nursing notes.  Pertinent labs & imaging results that were available during my care of the patient were reviewed by me and considered in my medical decision making (see chart for details).    MDM Rules/Calculators/A&P                      Patient presents with a variety of complaints.  Viral syndrome is a possibility. Patient is nontoxic appearing, afebrile, not tachycardic, not tachypneic, not hypotensive, maintains excellent SPO2 on room air, and is in no apparent distress.  No leukocytosis. I reviewed and interpreted the patient's labs and radiological studies. Troponin negative.  Single troponin was used due to the duration of the patient's symptoms. Well's Criteria of 0. PERC negative. CXR without acute abnormality.  Due to the patient's chest discomfort and global ST elevation on EKG, possibly suggestive of pericarditis, ESR and CRP were ordered.  Per UpToDate, these markers are not as useful in the diagnosis, but rather if they are elevated can be used to track improvement.  The patient was given instructions for home care as well as return precautions. Patient voices understanding of these instructions, accepts the plan, and is comfortable with discharge.   Jonathon Bolton was evaluated in Emergency Department on 06/24/2019 for the symptoms described in the history of present illness. He was evaluated in the context of the global COVID-19 pandemic, which necessitated consideration that the patient might be at risk for infection with the SARS-CoV-2 virus that causes COVID-19. Institutional protocols and algorithms that pertain to the evaluation of patients at risk for COVID-19 are in a state of rapid change based on information  released by regulatory bodies including the CDC and federal and state organizations. These policies and algorithms were followed during the patient's care in the ED.  Final Clinical Impression(s) / ED Diagnoses Final diagnoses:  Atypical chest pain  Viral syndrome    Rx / DC Orders ED Discharge Orders         Ordered    ibuprofen (ADVIL) 600 MG tablet  3 times daily     06/24/19 1212    ondansetron (ZOFRAN ODT) 4 MG disintegrating tablet  Every 8 hours PRN     06/24/19 1212    dicyclomine (BENTYL) 20 MG tablet  2 times daily     06/24/19 1212    benzonatate (TESSALON) 100 MG capsule  Every 8 hours     06/24/19 1212    fluticasone (FLONASE) 50 MCG/ACT nasal spray  Daily     06/24/19 1212           Lorayne Bender, PA-C 06/24/19 [place], Ashley, DO 06/24/19 1935

## 2019-06-25 LAB — RPR: RPR Ser Ql: NONREACTIVE

## 2019-06-26 ENCOUNTER — Ambulatory Visit: Payer: Self-pay | Admitting: Cardiology

## 2019-06-29 ENCOUNTER — Ambulatory Visit: Payer: Self-pay | Admitting: Cardiology

## 2019-07-03 ENCOUNTER — Ambulatory Visit: Payer: Self-pay | Admitting: Cardiology

## 2020-01-17 ENCOUNTER — Other Ambulatory Visit: Payer: Self-pay

## 2020-01-17 ENCOUNTER — Ambulatory Visit (HOSPITAL_COMMUNITY)
Admission: EM | Admit: 2020-01-17 | Discharge: 2020-01-17 | Disposition: A | Discharge status: Home / Self Care | Payer: Self-pay | Attending: Family Medicine | Admitting: Family Medicine

## 2020-01-17 ENCOUNTER — Ambulatory Visit (INDEPENDENT_AMBULATORY_CARE_PROVIDER_SITE_OTHER): Payer: Self-pay

## 2020-01-17 ENCOUNTER — Encounter (HOSPITAL_COMMUNITY): Payer: Self-pay | Admitting: *Deleted

## 2020-01-17 DIAGNOSIS — S93402A Sprain of unspecified ligament of left ankle, initial encounter: Secondary | ICD-10-CM

## 2020-01-17 DIAGNOSIS — M25572 Pain in left ankle and joints of left foot: Secondary | ICD-10-CM

## 2020-01-17 DIAGNOSIS — M25472 Effusion, left ankle: Secondary | ICD-10-CM

## 2020-01-17 DIAGNOSIS — M79672 Pain in left foot: Secondary | ICD-10-CM

## 2020-01-17 DIAGNOSIS — S99922A Unspecified injury of left foot, initial encounter: Secondary | ICD-10-CM

## 2020-01-17 MED ORDER — CYCLOBENZAPRINE HCL 10 MG PO TABS: 10.0000 mg | ORAL_TABLET | Freq: Two times a day (BID) | ORAL | 0 refills | Status: AC | PRN

## 2020-01-17 MED ORDER — IBUPROFEN 800 MG PO TABS: 800.0000 mg | ORAL_TABLET | Freq: Three times a day (TID) | ORAL | 0 refills | Status: AC | PRN

## 2020-01-17 NOTE — ED Triage Notes (Addendum)
C/O left foot pain after going up for a layup in basketball today  Pt ambulates with limp.  LLE CMS intact.  Denies any left ankle pain with palpation.  Pt states has been having intermittent left chest pains over past few months; states was seen at [facility], and was to have a cardiology f/u, but pt cancelled appt twice.  Denies any current chest pain.

## 2020-01-17 NOTE — ED Notes (Addendum)
No tall crutches available for this patient, called ortho tech-crutches for 6\' 0"  brought to [facility] by hospital ortho tech

## 2020-01-17 NOTE — ED Provider Notes (Signed)
[facility]   [id number] 01/17/20 Arrival Time: 1720  QM:GNOIB PAIN  SUBJECTIVE: History from: patient. Jonathon Bolton is a 34 y.o. male complains of left ankle pain that began around lunchtime today. Reports that he was playing basketball landed on his left leg and rolled his ankle. Reports that the area is swollen, very tender to touch. Has not taken any medications for this. Has not had this type of injury before. Symptoms are made worse with activity. Denies similar symptoms in the past. Denies fever, chills, erythema, ecchymosis, weakness, numbness and tingling, saddle paresthesias, loss of bowel or bladder function.      ROS: As per HPI.  All other pertinent ROS negative.     History reviewed. No pertinent past medical history. History reviewed. No pertinent surgical history. No Known Allergies No current facility-administered medications on file prior to encounter.   Current Outpatient Medications on File Prior to Encounter  Medication Sig Dispense Refill  . aspirin-acetaminophen-caffeine (EXCEDRIN MIGRAINE) 250-250-65 MG per tablet Take 2 tablets by mouth every 6 (six) hours as needed for headache or migraine.    . benzonatate (TESSALON) 100 MG capsule Take 1 capsule (100 mg total) by mouth every 8 (eight) hours. 21 capsule 0  . cephALEXin (KEFLEX) 500 MG capsule Take 1 capsule (500 mg total) by mouth 4 (four) times daily. 20 capsule 0  . cetirizine-pseudoephedrine (ZYRTEC-D) 5-120 MG tablet Take 1 tablet by mouth 2 (two) times daily. 30 tablet 0  . dicyclomine (BENTYL) 20 MG tablet Take 1 tablet (20 mg total) by mouth 2 (two) times daily. 20 tablet 0  . fluticasone (FLONASE) 50 MCG/ACT nasal spray Place 2 sprays into both nostrils daily. 16 g 0  . ondansetron (ZOFRAN ODT) 4 MG disintegrating tablet Take 1 tablet (4 mg total) by mouth every 8 (eight) hours as needed for nausea or vomiting. 20 tablet 0  . oxyCODONE-acetaminophen (PERCOCET) 7.5-325 MG per tablet Take 1  tablet by mouth every 4 (four) hours as needed for severe pain. 15 tablet 0  . tamsulosin (FLOMAX) 0.4 MG CAPS capsule Take 1 capsule (0.4 mg total) by mouth daily. 30 capsule 0   Social History   Socioeconomic History  . Marital status: Single    Spouse name: Not on file  . Number of children: Not on file  . Years of education: Not on file  . Highest education level: Not on file  Occupational History  . Not on file  Tobacco Use  . Smoking status: Former Games developer  . Smokeless tobacco: Never Used  Vaping Use  . Vaping Use: Never used  Substance and Sexual Activity  . Alcohol use: Yes    Comment: occasionally  . Drug use: Yes    Types: Marijuana  . Sexual activity: Not on file  Other Topics Concern  . Not on file  Social History Narrative  . Not on file   Social Determinants of Health   Financial Resource Strain:   . Difficulty of Paying Living Expenses: Not on file  Food Insecurity:   . Worried About Programme researcher, broadcasting/film/video in the Last Year: Not on file  . Ran Out of Food in the Last Year: Not on file  Transportation Needs:   . Lack of Transportation (Medical): Not on file  . Lack of Transportation (Non-Medical): Not on file  Physical Activity:   . Days of Exercise per Week: Not on file  . Minutes of Exercise per Session: Not on file  Stress:   .  Feeling of Stress : Not on file  Social Connections:   . Frequency of Communication with Friends and Family: Not on file  . Frequency of Social Gatherings with Friends and Family: Not on file  . Attends Religious Services: Not on file  . Active Member of Clubs or Organizations: Not on file  . Attends Banker Meetings: Not on file  . Marital Status: Not on file  Intimate Partner Violence:   . Fear of Current or Ex-Partner: Not on file  . Emotionally Abused: Not on file  . Physically Abused: Not on file  . Sexually Abused: Not on file   Family History  Problem Relation Age of Onset  . Healthy Mother      OBJECTIVE:  Vitals:   01/17/20 1813  BP: (!) 136/96  Pulse: 76  Resp: 16  Temp: 98.8 F (37.1 C)  TempSrc: Oral  SpO2: 96%    General appearance: ALERT; in no acute distress.  Head: NCAT Lungs: Normal respiratory effort IR:WERXVQ 2+ bilaterally. Cap refill < 2 seconds Musculoskeletal:  Inspection: Skin warm, dry, clear and intact without obvious erythema, effusion, or ecchymosis.  Palpation: Lateral aspect of the left ankle is tender to palpation ROM: Limited ROM active and passive to left ankle Skin: warm and dry Neurologic: Ambulates without difficulty; Sensation intact about the upper/ lower extremities Psychological: alert and cooperative; normal mood and affect  DIAGNOSTIC STUDIES:  DG Foot Complete Left  Result Date: 01/17/2020 CLINICAL DATA:  Basketball injury with foot pain, initial encounter EXAM: LEFT FOOT - COMPLETE 3+ VIEW COMPARISON:  None. FINDINGS: There is no evidence of fracture or dislocation. There is no evidence of arthropathy or other focal bone abnormality. Soft tissues are unremarkable. IMPRESSION: No acute abnormality noted. Electronically Signed   By: Alcide Clever M.D.   On: 01/17/2020 19:47     ASSESSMENT & PLAN:  1. Pain and swelling of left ankle   2. Sprain of left ankle, unspecified ligament, initial encounter      Meds ordered this encounter  Medications  . ibuprofen (ADVIL) 800 MG tablet    Sig: Take 1 tablet (800 mg total) by mouth every 8 (eight) hours as needed for moderate pain.    Dispense:  21 tablet    Refill:  0    Order Specific Question:   Supervising Provider    Answer:   Merrilee Jansky [id number]  . cyclobenzaprine (FLEXERIL) 10 MG tablet    Sig: Take 1 tablet (10 mg total) by mouth 2 (two) times daily as needed for muscle spasms.    Dispense:  20 tablet    Refill:  0    Order Specific Question:   Supervising Provider    Answer:   Merrilee Jansky [[id number]]    X-ray today negative Ibuprofen  prescribed Flexeril prescribed ASO brace applied in office today  Continue conservative management of rest, ice, and gentle stretches Take ibuprofen as needed for pain relief (may cause abdominal discomfort, ulcers, and GI bleeds avoid taking with other NSAIDs) Take cyclobenzaprine at nighttime for symptomatic relief. Avoid driving or operating heavy machinery while using medication. Follow up with PCP if symptoms persist Return or go to the ER if you have any new or worsening symptoms (fever, chills, chest pain, abdominal pain, changes in bowel or bladder habits, pain radiating into lower legs)   Reviewed expectations re: course of current medical issues. Questions answered. Outlined signs and symptoms indicating need for more acute intervention. Patient verbalized understanding.  After Visit Summary given.       Moshe Cipro, NP 01/17/20 1956

## 2020-01-17 NOTE — Progress Notes (Signed)
Orthopedic Tech Progress Note Patient Details:  Jonathon Bolton 02/15/1986 [id number]  Ortho Devices Type of Ortho Device: Crutches Ortho Device/Splint Location: delivered crutches to rn due to them not having any of the right size for the patient. Ortho Device/Splint Interventions: Rich Brave 01/17/2020, 9:30 PM

## 2020-01-17 NOTE — Discharge Instructions (Signed)
Take ibuprofen as needed.  Rest and elevate your foot.  Apply ice packs 2-3 times a day for up to 20 minutes each.  Wear the brace as needed for comfort.    Follow up with your primary care provider or an orthopedist if you symptoms continue or worsen;  Or if you develop new symptoms, such as numbness, tingling, or weakness.     

## 2020-03-12 IMAGING — DX DG CHEST 1V PORT
1 series · 1 of 1 positions shown · non-contrast
Comparison: July 11, 2017

CLINICAL DATA: Shortness of breath with cough and chest pain

EXAM:
PORTABLE CHEST 1 VIEW

[chest ap]
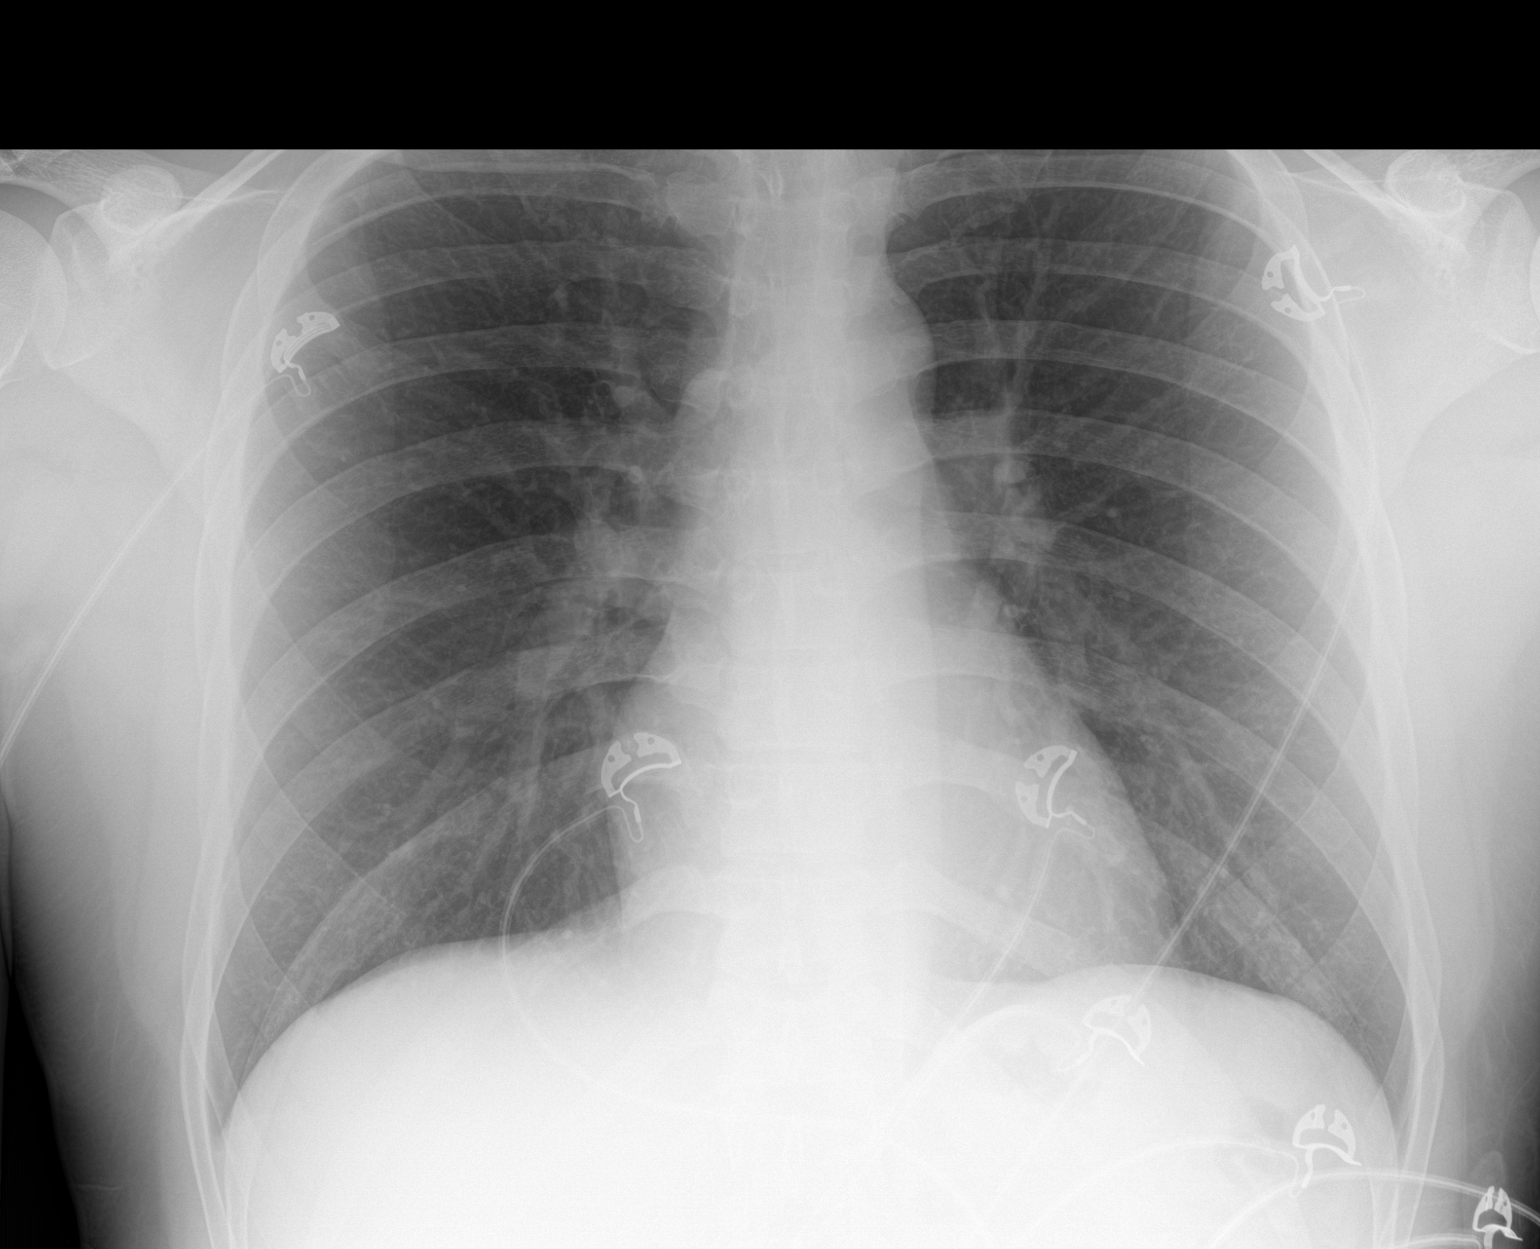

[1 of 1 positions shown; findings below may reference images not displayed]

FINDINGS: Lungs are clear. Heart size and pulmonary vascularity are normal. No
adenopathy. No pneumothorax. No bone lesions.
IMPRESSION: No abnormality noted.

## 2022-02-23 ENCOUNTER — Emergency Department (HOSPITAL_COMMUNITY)
Admission: EM | Admit: 2022-02-23 | Discharge: 2022-02-23 | Disposition: A | Discharge status: Home / Self Care | Payer: Self-pay | Attending: Emergency Medicine | Admitting: Emergency Medicine

## 2022-02-23 ENCOUNTER — Other Ambulatory Visit: Payer: Self-pay

## 2022-02-23 ENCOUNTER — Encounter (HOSPITAL_COMMUNITY): Payer: Self-pay

## 2022-02-23 DIAGNOSIS — R197 Diarrhea, unspecified: Secondary | ICD-10-CM | POA: Insufficient documentation

## 2022-02-23 DIAGNOSIS — R112 Nausea with vomiting, unspecified: Secondary | ICD-10-CM | POA: Insufficient documentation

## 2022-02-23 DIAGNOSIS — R1110 Vomiting, unspecified: Secondary | ICD-10-CM | POA: Insufficient documentation

## 2022-02-23 LAB — COMPREHENSIVE METABOLIC PANEL
ALT: 53 U/L — ABNORMAL HIGH (ref 0–44)
AST: 34 U/L (ref 15–41)
Albumin: 4.6 g/dL (ref 3.5–5.0)
Alkaline Phosphatase: 73 U/L (ref 38–126)
Anion gap: 7 (ref 5–15)
BUN: 11 mg/dL (ref 6–20)
CO2: 26 mmol/L (ref 22–32)
Calcium: 9.1 mg/dL (ref 8.9–10.3)
Chloride: 107 mmol/L (ref 98–111)
Creatinine, Ser: 1.29 mg/dL — ABNORMAL HIGH (ref 0.61–1.24)
GFR, Estimated: >60 mL/min (ref >60)
Glucose, Bld: 108 mg/dL — ABNORMAL HIGH (ref 70–99)
Potassium: 4.1 mmol/L (ref 3.5–5.1)
Sodium: 140 mmol/L (ref 135–145)
Total Bilirubin: 1.1 mg/dL (ref 0.3–1.2)
Total Protein: 8.2 g/dL — ABNORMAL HIGH (ref 6.5–8.1)

## 2022-02-23 LAB — URINALYSIS, ROUTINE W REFLEX MICROSCOPIC
Bacteria, UA: NONE SEEN
Bilirubin Urine: NEGATIVE
Glucose, UA: NEGATIVE mg/dL
Hgb urine dipstick: NEGATIVE
Ketones, ur: 20 mg/dL — AB
Leukocytes,Ua: NEGATIVE
Nitrite: NEGATIVE
Protein, ur: 30 mg/dL — AB
Specific Gravity, Urine: 1.025 (ref 1.005–1.030)
pH: 7 (ref 5.0–8.0)

## 2022-02-23 LAB — CBC
HCT: 48.1 % (ref 39.0–52.0)
Hemoglobin: 16.1 g/dL (ref 13.0–17.0)
MCH: 29.3 pg (ref 26.0–34.0)
MCHC: 33.5 g/dL (ref 30.0–36.0)
MCV: 87.6 fL (ref 80.0–100.0)
Platelets: 186 10*3/uL (ref 150–400)
RBC: 5.49 MIL/uL (ref 4.22–5.81)
RDW: 12.6 % (ref 11.5–15.5)
WBC: 8.2 10*3/uL (ref 4.0–10.5)
nRBC: 0 % (ref 0.0–0.2)

## 2022-02-23 LAB — LIPASE, BLOOD: Lipase: 36 U/L (ref 11–51)

## 2022-02-23 MED ORDER — ONDANSETRON 4 MG PO TBDP
4.0000 mg | ORAL_TABLET | Freq: Once | ORAL | Status: AC
  Administered 2022-02-23: 4 mg via ORAL
  Filled 2022-02-23: qty 1

## 2022-02-23 MED ORDER — ONDANSETRON HCL 4 MG PO TABS: 4.0000 mg | ORAL_TABLET | Freq: Three times a day (TID) | ORAL | 0 refills | Status: AC | PRN

## 2022-02-23 NOTE — ED Provider Notes (Signed)
[facility] EMERGENCY DEPARTMENT Provider Note   CSN: [id number] Arrival date & time: 02/23/22  1524     History  Chief Complaint  Patient presents with   Abdominal Pain    Jonathon Bolton is a 36 y.o. male.   Abdominal Pain Patient presents abdominal pain.  Developed nausea vomiting some mild abdominal pain and a little diarrhea.  Woke with it this morning.  No sick contacts.  States he did eat [employer] last night.  No fevers or chills.  No cough.  Does smoke marijuana heavily.     Home Medications Prior to Admission medications   Medication Sig Start Date End Date Taking? Authorizing Provider  ondansetron (ZOFRAN) 4 MG tablet Take 1 tablet (4 mg total) by mouth every 8 (eight) hours as needed for nausea or vomiting. 02/23/22  Yes Davonna Belling, MD  aspirin-acetaminophen-caffeine (EXCEDRIN MIGRAINE) [phone] MG per tablet Take 2 tablets by mouth every 6 (six) hours as needed for headache or migraine.    [provider]  benzonatate (TESSALON) 100 MG capsule Take 1 capsule (100 mg total) by mouth every 8 (eight) hours. 06/24/19   Joy, Shawn C, PA-C  cephALEXin (KEFLEX) 500 MG capsule Take 1 capsule (500 mg total) by mouth 4 (four) times daily. 12/29/14   Lacretia Leigh, MD  cetirizine-pseudoephedrine (ZYRTEC-D) 5-120 MG tablet Take 1 tablet by mouth 2 (two) times daily. 07/11/17   Evalee Jefferson, PA-C  cyclobenzaprine (FLEXERIL) 10 MG tablet Take 1 tablet (10 mg total) by mouth 2 (two) times daily as needed for muscle spasms. 01/17/20   Faustino Congress, NP  dicyclomine (BENTYL) 20 MG tablet Take 1 tablet (20 mg total) by mouth 2 (two) times daily. 06/24/19   Joy, Shawn C, PA-C  fluticasone (FLONASE) 50 MCG/ACT nasal spray Place 2 sprays into both nostrils daily. 06/24/19   Joy, Shawn C, PA-C  ibuprofen (ADVIL) 800 MG tablet Take 1 tablet (800 mg total) by mouth every 8 (eight) hours as needed for moderate pain. 01/17/20   Faustino Congress, NP  oxyCODONE-acetaminophen  (PERCOCET) 7.5-325 MG per tablet Take 1 tablet by mouth every 4 (four) hours as needed for severe pain. 12/29/14   Lacretia Leigh, MD  tamsulosin (FLOMAX) 0.4 MG CAPS capsule Take 1 capsule (0.4 mg total) by mouth daily. 12/29/14   Lacretia Leigh, MD      Allergies    Patient has no known allergies.    Review of Systems   Review of Systems  Gastrointestinal:  Positive for abdominal pain.    Physical Exam Updated Vital Signs BP 116/78 (BP Location: Right Arm)   Pulse 69   Temp 98.4 F (36.9 C) (Oral)   Resp 16   Ht 5\' 11"  (1.803 m)   Wt 98.4 kg   SpO2 99%   BMI 30.27 kg/m  Physical Exam Vitals and nursing note reviewed.  HENT:     Head: Atraumatic.  Cardiovascular:     Rate and Rhythm: Normal rate and regular rhythm.  Pulmonary:     Breath sounds: Normal breath sounds.  Abdominal:     Hernia: No hernia is present.     Comments: Mild upper abdominal tenderness rebound or guarding.  No hernias palpated.  Neurological:     Mental Status: He is alert.     ED Results / Procedures / Treatments   Labs (all labs ordered are listed, but only abnormal results are displayed) Labs Reviewed  COMPREHENSIVE METABOLIC PANEL - Abnormal; Notable for the following components:  Result Value   Glucose, Bld 108 (*)    Creatinine, Ser 1.29 (*)    Total Protein 8.2 (*)    ALT 53 (*)    All other components within normal limits  URINALYSIS, ROUTINE W REFLEX MICROSCOPIC - Abnormal; Notable for the following components:   Ketones, ur 20 (*)    Protein, ur 30 (*)    All other components within normal limits  LIPASE, BLOOD  CBC    EKG None  Radiology No results found.  Procedures Procedures    Medications Ordered in ED Medications  ondansetron (ZOFRAN-ODT) disintegrating tablet 4 mg (4 mg Oral Given 02/23/22 1820)    ED Course/ Medical Decision Making/ A&P                           Medical Decision Making Amount and/or Complexity of Data Reviewed Labs:  ordered.  Risk Prescription drug management.   Patient with nausea vomiting little bit of diarrhea.  Began this morning when he woke up.  Lab work reviewed and reassuring.  Creatinine mildly elevated but appears to be his baseline.  Doubt obstruction.  Benign abdominal exam and doubt severe intra-abdominal infection.  Although cannabinol hyperemesis is considered since he is a heavy marijuana user I think it is probably less likely.  We will treat symptomatically with Zofran and given oral trial.  Has tolerated orals.  Feels better after the Zofran and has not vomited.  Will discharge home with symptomatic treatment.         Final Clinical Impression(s) / ED Diagnoses Final diagnoses:  Nausea and vomiting, unspecified vomiting type    Rx / DC Orders ED Discharge Orders          Ordered    ondansetron (ZOFRAN) 4 MG tablet  Every 8 hours PRN        02/23/22 1908              Benjiman Core, MD 02/23/22 1913

## 2022-02-23 NOTE — ED Triage Notes (Signed)
Pt reports abd pain, n/v, and 1 episode of diarrhea since waking today.

## 2022-02-23 NOTE — ED Notes (Signed)
Pt tolerated drinking water without difficulty; pt denies any nausea or vomiting at this time.

## 2022-07-06 LAB — LAB REPORT - SCANNED: EGFR: 83

## 2022-07-25 ENCOUNTER — Encounter: Payer: Self-pay | Admitting: Orthopaedic Surgery

## 2022-07-25 ENCOUNTER — Ambulatory Visit (INDEPENDENT_AMBULATORY_CARE_PROVIDER_SITE_OTHER): Payer: Medicaid Other | Admitting: Orthopaedic Surgery

## 2022-07-25 VITALS — Ht 72.0 in | Wt 213.0 lb

## 2022-07-25 DIAGNOSIS — S43012S Anterior subluxation of left humerus, sequela: Secondary | ICD-10-CM | POA: Diagnosis not present

## 2022-07-25 DIAGNOSIS — S43012A Anterior subluxation of left humerus, initial encounter: Secondary | ICD-10-CM | POA: Insufficient documentation

## 2022-07-25 NOTE — Progress Notes (Signed)
Office Visit Note   Patient: Jonathon Bolton           Date of Birth: 26-Dec-1985           MRN: [place]:[id number] Visit Date: 07/25/2022              Requested by: No referring provider defined for this encounter. PCP: Jonathon Burly, MD   Assessment & Plan: Visit Diagnoses:  1. Anterior subluxation of left shoulder, sequela     Plan: Will set patient up for some physical therapy for subscap strengthening exercises for likely anterior instability.  Recheck 7 weeks.  If he is having persistent symptoms we will consider appropriate imaging studies.  Follow-Up Instructions: Return in about 7 weeks (around 09/12/2022).   Orders:  No orders of the defined types were placed in this encounter.  No orders of the defined types were placed in this encounter.     Procedures: No procedures performed   Clinical Data: No additional findings.   Subjective: Chief Complaint  Patient presents with   Left Shoulder - Pain    HPI53 year old male with left nondominant shoulder pain for several years.  He is playing basketball clinic for rebound as he fell to the floor the other player landed on his shoulder his arms over his head with abduction and he states his shoulder felt like it popped loudly had to reach behind and pull on his shoulder to decrease the pain.  Since that time he has had problems with abduction and external rotation.  He denies problems with his opposite dominant right shoulder with this maneuver.  He has problems if he wakes up with his arm up over his head and abduction. Patient referred by Dr.Hasanaj.  Review of Systems All systems are noncontributory to HPI.  Objective: Vital Signs: Ht 6' (1.829 m)   Wt 213 lb (96.6 kg)   BMI 28.89 kg/m   Physical Exam Constitutional:      Appearance: He is well-developed.  HENT:     Head: Normocephalic and atraumatic.     Right Ear: External ear normal.     Left Ear: External ear normal.  Eyes:     Pupils: Pupils are equal,  round, and reactive to light.  Neck:     Thyroid: No thyromegaly.     Trachea: No tracheal deviation.  Cardiovascular:     Rate and Rhythm: Normal rate.  Pulmonary:     Effort: Pulmonary effort is normal.     Breath sounds: No wheezing.  Abdominal:     General: Bowel sounds are normal.     Palpations: Abdomen is soft.  Musculoskeletal:     Cervical back: Neck supple.  Skin:    General: Skin is warm and dry.     Capillary Refill: Capillary refill takes less than 2 seconds.  Neurological:     Mental Status: He is alert and oriented to person, place, and time.  Psychiatric:        Behavior: Behavior normal.        Thought Content: Thought content normal.        Judgment: Judgment normal.     Ortho Exam patient has mild apprehension with abduction external rotation left shoulder and slight inferior laxity with the lateral sulcus sign.  No apprehension with opposite right shoulder external rotation good endpoint.  Humeral head subluxes slightly anterior with external rotation.  Reflexes sensation hand are intact.  Specialty Comments:  No specialty comments available.  Imaging: Shoulder x-rays  07/12/2022 shows soft tissue calcification projects just medial to the inferior aspect humeral head no acute fracture.  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  No other bone or soft tissue abnormalities.  Read by Jonathon Bolton on 07/12/2022.   PMFS History: Patient Active Problem List   Diagnosis Date Noted   Anterior subluxation of left shoulder 07/25/2022   No past medical history on file.  Family History  Problem Relation Age of Onset   Jonathon Bolton     No past surgical history on file. Social History   Occupational History   Not on file  Tobacco Use   Smoking status: Never   Smokeless tobacco: Never  Vaping Use   Vaping Use: Never used  Substance and Sexual Activity   Alcohol use: Yes    Comment: occasionally   Drug use: Yes    Types: Marijuana   Sexual activity: Not on  file

## 2022-07-25 NOTE — Addendum Note (Signed)
Addended by: Meyer Cory on: 07/25/2022 02:38 PM   Modules accepted: Orders

## 2022-08-13 ENCOUNTER — Ambulatory Visit: Payer: Medicaid Other

## 2022-08-21 ENCOUNTER — Ambulatory Visit: Payer: Medicaid Other | Attending: Orthopaedic Surgery | Admitting: Physical Therapy

## 2022-08-21 ENCOUNTER — Encounter: Payer: Self-pay | Admitting: Physical Therapy

## 2022-08-21 ENCOUNTER — Other Ambulatory Visit: Payer: Self-pay

## 2022-08-21 DIAGNOSIS — S43012S Anterior subluxation of left humerus, sequela: Secondary | ICD-10-CM | POA: Insufficient documentation

## 2022-08-21 DIAGNOSIS — R293 Abnormal posture: Secondary | ICD-10-CM | POA: Insufficient documentation

## 2022-08-21 DIAGNOSIS — G8929 Other chronic pain: Secondary | ICD-10-CM | POA: Insufficient documentation

## 2022-08-21 DIAGNOSIS — M25512 Pain in left shoulder: Secondary | ICD-10-CM | POA: Diagnosis not present

## 2022-08-21 NOTE — Therapy (Signed)
OUTPATIENT PHYSICAL THERAPY SHOULDER EVALUATION   Patient Name: Jonathon Bolton MRN: [id number] DOB:[birth year], 37 y.o., male Today's Date: 08/21/2022  END OF SESSION:  PT End of Session - 08/21/22 1443     Visit Number 1    Number of Visits 8    Date for PT Re-Evaluation 09/18/22    PT Start Time 0215    PT Stop Time 0243    PT Time Calculation (min) 28 min    Activity Tolerance Patient tolerated treatment well    Behavior During Therapy [facility] for tasks assessed/performed             History reviewed. No pertinent past medical history. History reviewed. No pertinent surgical history. Patient Active Problem List   Diagnosis Date Noted   Anterior subluxation of left shoulder 07/25/2022   REFERRING PROVIDER: Annell Greening MD  REFERRING DIAG: Anterior subluxation of left shoulder.  THERAPY DIAG:  Chronic left shoulder pain - Plan: PT plan of care cert/re-cert  Abnormal posture - Plan: PT plan of care cert/re-cert  Rationale for Evaluation and Treatment: Rehabilitation  ONSET DATE: ~5 years.  SUBJECTIVE:                                                                                                                                                                                      SUBJECTIVE STATEMENT: The patient presents to the clinic with c/o chronic left shoulder pain that happened about 5 years ago when he fell during a basketball game and another player fell on his shoulder.  He rates his pain at a 6/10 today but states certain motions will increase his pain to higher levels.  For example, he tried to shot a basketball the other day and it caused severe pain.  Resting with arm by side decreases pain.   PERTINENT HISTORY: Unremarkable.  PAIN:  Are you having pain? Yes: NPRS scale: 6/10 Pain location: Left shoulder. Pain description: Ache, sore. Aggravating factors: As above. Relieving factors: As above.  PRECAUTIONS: Other: Avoid pain increasing  therex.  WEIGHT BEARING RESTRICTIONS: No  FALLS:  Has patient fallen in last 6 months? No  PLOF: Independent  PATIENT GOALS:Use left shoulder without pain.  NEXT MD VISIT:   OBJECTIVE:   DIAGNOSTIC FINDINGS:  Shoulder x-rays 07/12/2022 shows soft tissue calcification projects just medial to the inferior aspect humeral head no acute fracture. 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 No other bone or soft tissue abnormalities   POSTURE: Left shoulder depression.  UPPER EXTREMITY ROM:   Full active left shoulder motion.  UPPER EXTREMITY MMT:  Left deltoid and RTC strength is a solid 4+/5.  SHOULDER SPECIAL TESTS: No significant pain  increase with a left Speed's test.  In supine:  Left shoulder rotation in plane of scapula produced significant crepitus and there was no crepitus on the right.    PALPATION:  C/o tenderness over his left bicipital groove region and some mild palpable tenderness over his left posterior cuff musculature.   PATIENT EDUCATION: Education details: Discussed shoulder anatomy. Person educated: Patient Education method: Explanation Education comprehension: verbalized understanding  HOME EXERCISE PROGRAM:   ASSESSMENT:  CLINICAL IMPRESSION: The patient presents to [facility] with c/o chronic left shoulder pain as the result of another player falling on his left shoulder.  He demonstrates full active left shoulder motion but is cautious as certain movements can produce severe pain.  His posture is remarkable for left shoulder depression.  He exhibits minimal left shoulder weakness.  His shoulder is remarkable for crepitus.  Patient will benefit from skilled physical therapy intervention to address pain and deficits.   OBJECTIVE IMPAIRMENTS: decreased activity tolerance, decreased strength, increased muscle spasms, postural dysfunction, and pain.   ACTIVITY LIMITATIONS: lifting and reach over head  PERSONAL FACTORS: Time since onset of injury/illness/exacerbation  are also affecting patient's functional outcome.   REHAB POTENTIAL: Good minus.  CLINICAL DECISION MAKING: Evolving/moderate complexity  EVALUATION COMPLEXITY: Low   GOALS:  SHORT TERM GOALS: Target date: 09/18/22.  Ind with an HEP. Goal status: INITIAL  2.  Increase right shoulder strength to a solid 5/5 to increase stability for performance of functional activities.  Goal status: INITIAL  3.  Perform ADL's with left shoulder pain not > 3/10.  Goal status: INITIAL   PLAN:  PT FREQUENCY: 2x/week  PT DURATION: 4 weeks  PLANNED INTERVENTIONS: Therapeutic exercises, Therapeutic activity, Neuromuscular re-education, Patient/Family education, Self Care, and Manual therapy  PLAN FOR NEXT SESSION: RTC strengthening, STW/M.   Chanel Mcadams, [place], PT 08/21/2022, 3:18 PM

## 2022-09-06 ENCOUNTER — Ambulatory Visit: Payer: Medicaid Other | Attending: Orthopaedic Surgery | Admitting: Physical Therapy

## 2022-09-06 DIAGNOSIS — G8929 Other chronic pain: Secondary | ICD-10-CM

## 2022-09-06 DIAGNOSIS — M25512 Pain in left shoulder: Secondary | ICD-10-CM | POA: Diagnosis present

## 2022-09-06 DIAGNOSIS — R293 Abnormal posture: Secondary | ICD-10-CM | POA: Diagnosis present

## 2022-09-06 NOTE — Therapy (Signed)
OUTPATIENT PHYSICAL THERAPY SHOULDER EVALUATION   Patient Name: Jonathon Bolton MRN: [id number] DOB:[birth year], 37 y.o., male Today's Date: 09/06/2022  END OF SESSION:  PT End of Session - 09/06/22 1546     Visit Number 2    Number of Visits 8    Date for PT Re-Evaluation 09/18/22    PT Start Time 0230    PT Stop Time 0305    PT Time Calculation (min) 35 min    Activity Tolerance Patient tolerated treatment well    Behavior During Therapy [facility] for tasks assessed/performed             No past medical history on file. No past surgical history on file. Patient Active Problem List   Diagnosis Date Noted   Anterior subluxation of left shoulder 07/25/2022   REFERRING PROVIDER: Annell Greening MD  REFERRING DIAG: Anterior subluxation of left shoulder.  THERAPY DIAG:  Chronic left shoulder pain  Abnormal posture  Rationale for Evaluation and Treatment: Rehabilitation  ONSET DATE: ~5 years.  SUBJECTIVE:                                                                                                                                                                                      SUBJECTIVE STATEMENT: Pain about a 3.    PERTINENT HISTORY: Unremarkable.  PAIN:  Are you having pain? Yes: NPRS scale: 3/10 Pain location: Left shoulder. Pain description: Ache, sore. Aggravating factors: As above. Relieving factors: As above.  PRECAUTIONS: Other: Avoid pain increasing therex.  WEIGHT BEARING RESTRICTIONS: No  FALLS:  Has patient fallen in last 6 months? No  PLOF: Independent  PATIENT GOALS:Use left shoulder without pain.  NEXT MD VISIT:   OBJECTIVE:   DIAGNOSTIC FINDINGS:  Shoulder x-rays 07/12/2022 shows soft tissue calcification projects just medial to the inferior aspect humeral head no acute fracture. 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 No other bone or soft tissue abnormalities   POSTURE: Left shoulder depression.  UPPER EXTREMITY ROM:   Full active left  shoulder motion.  UPPER EXTREMITY MMT:  Left deltoid and RTC strength is a solid 4+/5.  SHOULDER SPECIAL TESTS: No significant pain increase with a left Speed's test.  In supine:  Left shoulder rotation in plane of scapula produced significant crepitus and there was no crepitus on the right.    PALPATION:  C/o tenderness over his left bicipital groove region and some mild palpable tenderness over his left posterior cuff musculature.   PATIENT EDUCATION: Education details: [email or web address] Person educated: Patient Education method: Explanation Education comprehension: verbalized understanding.  Red theraband and handout provided.  OBJECTIVE:  EXERCISE LOG  Exercise Repetitions and Resistance Comments  UBE 90 RPM's x 10 mins (5 mins forward and 5 mins backward   Pulleys 4 minutes.   RW4 Red theraband to fatigue.           STW/M x 10 minutes to patient's left anterior shoulder and posterior cuff musculature with ischemic release technique utilized.    ASSESSMENT:  CLINICAL IMPRESSION: Patient did well with treatment today.  Completed RW4 exercises with excellent technique.  Handout and red theraband provided for HEP.  He had increased tone in his left posterior cuff musculature.  Good response to STW/M.  OBJECTIVE IMPAIRMENTS: decreased activity tolerance, decreased strength, increased muscle spasms, postural dysfunction, and pain.   ACTIVITY LIMITATIONS: lifting and reach over head  PERSONAL FACTORS: Time since onset of injury/illness/exacerbation are also affecting patient's functional outcome.   REHAB POTENTIAL: Good minus.  CLINICAL DECISION MAKING: Evolving/moderate complexity  EVALUATION COMPLEXITY: Low   GOALS:  SHORT TERM GOALS: Target date: 09/18/22.  Ind with an HEP. Goal status: INITIAL  2.  Increase right shoulder strength to a solid 5/5 to increase stability for performance of functional activities.  Goal status: INITIAL  3.   Perform ADL's with left shoulder pain not > 3/10.  Goal status: INITIAL   PLAN:  PT FREQUENCY: 2x/week  PT DURATION: 4 weeks  PLANNED INTERVENTIONS: Therapeutic exercises, Therapeutic activity, Neuromuscular re-education, Patient/Family education, Self Care, and Manual therapy  PLAN FOR NEXT SESSION: RTC strengthening, STW/M.   Isabeau Mccalla, [place], PT 09/06/2022, 3:53 PM

## 2022-09-11 ENCOUNTER — Ambulatory Visit: Payer: Medicaid Other | Admitting: Physical Therapy

## 2022-09-11 DIAGNOSIS — M25512 Pain in left shoulder: Secondary | ICD-10-CM | POA: Diagnosis not present

## 2022-09-11 DIAGNOSIS — G8929 Other chronic pain: Secondary | ICD-10-CM

## 2022-09-11 DIAGNOSIS — R293 Abnormal posture: Secondary | ICD-10-CM

## 2022-09-11 NOTE — Therapy (Signed)
OUTPATIENT PHYSICAL THERAPY SHOULDER EVALUATION   Patient Name: Jonathon Bolton MRN: [id number] DOB:Aug 18, 1985, 37 y.o., male Today's Date: 09/11/2022  END OF SESSION:  PT End of Session - 09/11/22 1438     Visit Number 3    Number of Visits 8    Date for PT Re-Evaluation 09/18/22    PT Start Time 0230    PT Stop Time 0303    PT Time Calculation (min) 33 min    Activity Tolerance Patient tolerated treatment well    Behavior During Therapy [facility] for tasks assessed/performed              No past medical history on file. No past surgical history on file. Patient Active Problem List   Diagnosis Date Noted   Anterior subluxation of left shoulder 07/25/2022   REFERRING PROVIDER: Annell Greening MD  REFERRING DIAG: Anterior subluxation of left shoulder.  THERAPY DIAG:  No diagnosis found.  Rationale for Evaluation and Treatment: Rehabilitation  ONSET DATE: ~5 years.  SUBJECTIVE:                                                                                                                                                                                      SUBJECTIVE STATEMENT: Pain about a 2-3.    PERTINENT HISTORY: Unremarkable.  PAIN:  Are you having pain? Yes: NPRS scale: 2-3/10 Pain location: Left shoulder. Pain description: Ache, sore. Aggravating factors: As above. Relieving factors: As above.  PRECAUTIONS: Other: Avoid pain increasing therex.  WEIGHT BEARING RESTRICTIONS: No  FALLS:  Has patient fallen in last 6 months? No  PLOF: Independent  PATIENT GOALS:Use left shoulder without pain.  NEXT MD VISIT:   OBJECTIVE:   DIAGNOSTIC FINDINGS:  Shoulder x-rays 07/12/2022 shows soft tissue calcification projects just medial to the inferior aspect humeral head no acute fracture. 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 No other bone or soft tissue abnormalities   POSTURE: Left shoulder depression.  UPPER EXTREMITY ROM:   Full active left shoulder  motion.  UPPER EXTREMITY MMT:  Left deltoid and RTC strength is a solid 4+/5.  SHOULDER SPECIAL TESTS: No significant pain increase with a left Speed's test.  In supine:  Left shoulder rotation in plane of scapula produced significant crepitus and there was no crepitus on the right.    PALPATION:  C/o tenderness over his left bicipital groove region and some mild palpable tenderness over his left posterior cuff musculature.   PATIENT EDUCATION: Education details: [id number] Person educated: Patient Education method: Explanation Education comprehension: verbalized understanding.  Red theraband and handout provided.  OBJECTIVE:  EXERCISE LOG  Exercise Repetitions and Resistance Comments  UBE 90 RPM's x 10 mins (5 mins forward and 5 mins backward   Pulleys 5 minutes.   RW4 Red theraband punches and ER and green for ext and IR all performed to fatique.   [facility] ER 2# to fatigue x 2.       STW/M x 8 minutes to patient's left anterior shoulder and posterior cuff musculature with ischemic release technique utilized.    ASSESSMENT:  CLINICAL IMPRESSION: Patient did well with treatment today.  Completed RW4 exercises with excellent technique and upgraded 2 motions with green theraband and provided band for HEP.  Also, added SDLY ER with 2#.   Good response to STW/M.  OBJECTIVE IMPAIRMENTS: decreased activity tolerance, decreased strength, increased muscle spasms, postural dysfunction, and pain.   ACTIVITY LIMITATIONS: lifting and reach over head  PERSONAL FACTORS: Time since onset of injury/illness/exacerbation are also affecting patient's functional outcome.   REHAB POTENTIAL: Good minus.  CLINICAL DECISION MAKING: Evolving/moderate complexity  EVALUATION COMPLEXITY: Low   GOALS:  SHORT TERM GOALS: Target date: 09/18/22.  Ind with an HEP. Goal status: INITIAL  2.  Increase right shoulder strength to a solid 5/5 to increase stability for  performance of functional activities.  Goal status: INITIAL  3.  Perform ADL's with left shoulder pain not > 3/10.  Goal status: INITIAL   PLAN:  PT FREQUENCY: 2x/week  PT DURATION: 4 weeks  PLANNED INTERVENTIONS: Therapeutic exercises, Therapeutic activity, Neuromuscular re-education, Patient/Family education, Self Care, and Manual therapy  PLAN FOR NEXT SESSION: RTC strengthening, STW/M.   Duvid Smalls, [place], PT 09/11/2022, 3:04 PM

## 2022-09-18 ENCOUNTER — Ambulatory Visit: Payer: Medicaid Other | Admitting: Physical Therapy

## 2022-09-18 DIAGNOSIS — G8929 Other chronic pain: Secondary | ICD-10-CM

## 2022-09-18 DIAGNOSIS — M25512 Pain in left shoulder: Secondary | ICD-10-CM | POA: Diagnosis not present

## 2022-09-18 DIAGNOSIS — R293 Abnormal posture: Secondary | ICD-10-CM

## 2022-09-18 NOTE — Therapy (Signed)
OUTPATIENT PHYSICAL THERAPY SHOULDER EVALUATION   Patient Name: Jonathon Bolton MRN: [id number] DOB:[birth year], 37 y.o., male Today's Date: 09/18/2022  END OF SESSION:  PT End of Session - 09/18/22 1511     Visit Number 4    Number of Visits 8    Date for PT Re-Evaluation 09/18/22    PT Start Time 0229    PT Stop Time 0302    PT Time Calculation (min) 33 min    Activity Tolerance Patient tolerated treatment well    Behavior During Therapy [facility] for tasks assessed/performed              No past medical history on file. No past surgical history on file. Patient Active Problem List   Diagnosis Date Noted   Anterior subluxation of left shoulder 07/25/2022   REFERRING PROVIDER: Annell Greening MD  REFERRING DIAG: Anterior subluxation of left shoulder.  THERAPY DIAG:  Chronic left shoulder pain  Abnormal posture  Rationale for Evaluation and Treatment: Rehabilitation  ONSET DATE: ~5 years.  SUBJECTIVE:                                                                                                                                                                                      SUBJECTIVE STATEMENT: Pain around a 1.  PERTINENT HISTORY: Unremarkable.  PAIN:  Are you having pain? Yes: NPRS scale: 1/10 Pain location: Left shoulder. Pain description: Ache, sore. Aggravating factors: As above. Relieving factors: As above.  PRECAUTIONS: Other: Avoid pain increasing therex.  WEIGHT BEARING RESTRICTIONS: No  FALLS:  Has patient fallen in last 6 months? No  PLOF: Independent  PATIENT GOALS:Use left shoulder without pain.  NEXT MD VISIT:   OBJECTIVE:   DIAGNOSTIC FINDINGS:  Shoulder x-rays 07/12/2022 shows soft tissue calcification projects just medial to the inferior aspect humeral head no acute fracture. 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 No other bone or soft tissue abnormalities   POSTURE: Left shoulder depression.  UPPER EXTREMITY ROM:   Full active left  shoulder motion.  UPPER EXTREMITY MMT:  Left deltoid and RTC strength is a solid 4+/5.  SHOULDER SPECIAL TESTS: No significant pain increase with a left Speed's test.  In supine:  Left shoulder rotation in plane of scapula produced significant crepitus and there was no crepitus on the right.    PALPATION:  C/o tenderness over his left bicipital groove region and some mild palpable tenderness over his left posterior cuff musculature.   PATIENT EDUCATION: Education details: [id number] Person educated: Patient Education method: Explanation Education comprehension: verbalized understanding.  Red theraband and handout provided.  OBJECTIVE:  EXERCISE LOG  Exercise Repetitions and Resistance Comments  UBE 90 RPM's x 10 mins (5 mins forward and 5 mins backward   Pulleys 4 minutes.   RW4 Green theraband to fatigue.   [facility] ER 3# to fatigue at 45 degrees of left shoulder abduction.   Full can 3# to fatigue.   Blue XTS Lat PD to fatigue.   STW/M x 9 minutes to patient's left anterior shoulder and posterior cuff musculature with ischemic release technique utilized.    ASSESSMENT:  CLINICAL IMPRESSION:  Completed RW4 exercises with green theraband.  Added ER with shoulder at 45 degrees of abduction, full can and lat pulldown with blue XTS tubing.  Pain low today.  Excellent response to treatment thus far.  OBJECTIVE IMPAIRMENTS: decreased activity tolerance, decreased strength, increased muscle spasms, postural dysfunction, and pain.   ACTIVITY LIMITATIONS: lifting and reach over head  PERSONAL FACTORS: Time since onset of injury/illness/exacerbation are also affecting patient's functional outcome.   REHAB POTENTIAL: Good minus.  CLINICAL DECISION MAKING: Evolving/moderate complexity  EVALUATION COMPLEXITY: Low   GOALS:  SHORT TERM GOALS: Target date: 09/18/22.  Ind with an HEP. Goal status: INITIAL  2.  Increase right shoulder strength to a solid  5/5 to increase stability for performance of functional activities.  Goal status: INITIAL  3.  Perform ADL's with left shoulder pain not > 3/10.  Goal status: INITIAL   PLAN:  PT FREQUENCY: 2x/week  PT DURATION: 4 weeks  PLANNED INTERVENTIONS: Therapeutic exercises, Therapeutic activity, Neuromuscular re-education, Patient/Family education, Self Care, and Manual therapy  PLAN FOR NEXT SESSION: RTC strengthening, STW/M.   Smokey Melott, [place], PT 09/18/2022, 3:13 PM

## 2022-09-25 ENCOUNTER — Ambulatory Visit: Payer: Medicaid Other | Admitting: Physical Therapy

## 2022-10-02 ENCOUNTER — Ambulatory Visit: Payer: Medicaid Other | Attending: Orthopaedic Surgery | Admitting: Physical Therapy

## 2022-10-02 DIAGNOSIS — M25512 Pain in left shoulder: Secondary | ICD-10-CM | POA: Diagnosis present

## 2022-10-02 DIAGNOSIS — R293 Abnormal posture: Secondary | ICD-10-CM | POA: Diagnosis present

## 2022-10-02 DIAGNOSIS — G8929 Other chronic pain: Secondary | ICD-10-CM | POA: Insufficient documentation

## 2022-10-02 NOTE — Therapy (Signed)
OUTPATIENT PHYSICAL THERAPY SHOULDER EVALUATION   Patient Name: Jonathon Bolton MRN: [id number] DOB:May 19, 1985, 37 y.o., male Today's Date: 10/02/2022  END OF SESSION:  PT End of Session - 10/02/22 1443     Visit Number 5    Number of Visits 8    Date for PT Re-Evaluation 09/18/22    PT Start Time 0230    PT Stop Time 0302    PT Time Calculation (min) 32 min    Activity Tolerance Patient tolerated treatment well    Behavior During Therapy [facility] for tasks assessed/performed               No past medical history on file. No past surgical history on file. Patient Active Problem List   Diagnosis Date Noted   Anterior subluxation of left shoulder 07/25/2022   REFERRING PROVIDER: Annell Greening MD  REFERRING DIAG: Anterior subluxation of left shoulder.  THERAPY DIAG:  Chronic left shoulder pain  Abnormal posture  Rationale for Evaluation and Treatment: Rehabilitation  ONSET DATE: ~5 years.  SUBJECTIVE:                                                                                                                                                                                      SUBJECTIVE STATEMENT: Been working out some.  Shoulder doing better. PERTINENT HISTORY: Unremarkable.  PAIN:  Are you having pain? Yes: NPRS scale: 2/10 Pain location: Left shoulder. Pain description: Ache, sore. Aggravating factors: As above. Relieving factors: As above.  PRECAUTIONS: Other: Avoid pain increasing therex.  WEIGHT BEARING RESTRICTIONS: No  FALLS:  Has patient fallen in last 6 months? No  PLOF: Independent  PATIENT GOALS:Use left shoulder without pain.  NEXT MD VISIT:   OBJECTIVE:   DIAGNOSTIC FINDINGS:  Shoulder x-rays 07/12/2022 shows soft tissue calcification projects just medial to the inferior aspect humeral head no acute fracture. 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 No other bone or soft tissue abnormalities   POSTURE: Left shoulder depression.  UPPER  EXTREMITY ROM:   Full active left shoulder motion.  UPPER EXTREMITY MMT:  Left deltoid and RTC strength is a solid 4+/5.  SHOULDER SPECIAL TESTS: No significant pain increase with a left Speed's test.  In supine:  Left shoulder rotation in plane of scapula produced significant crepitus and there was no crepitus on the right.    PALPATION:  C/o tenderness over his left bicipital groove region and some mild palpable tenderness over his left posterior cuff musculature.   PATIENT EDUCATION: Education details: [id number] Person educated: Patient Education method: Explanation Education comprehension: verbalized understanding.  Red theraband and handout provided.  OBJECTIVE:  EXERCISE LOG  Exercise Repetitions and Resistance Comments  UBE 90 RPM's x 10 mins (5 mins forward and 5 mins backward   Pulleys 5 minutes   RW4 Red theraband IE/ER, and punches and green for extension.(4 minutes total).   SDLY ER 2# to fatigue   Towel on plexiglass 1 minute   Blue XTS Lat PD to fatigue.   STW/M x 5 minutes to patient's left anterior shoulder/proximal bicep region.    ASSESSMENT:  CLINICAL IMPRESSION: Patient reports his left shoulder is improving.  She did well with the addition of lat PD with blue tubing and towel on plexiglass.  OBJECTIVE IMPAIRMENTS: decreased activity tolerance, decreased strength, increased muscle spasms, postural dysfunction, and pain.   ACTIVITY LIMITATIONS: lifting and reach over head  PERSONAL FACTORS: Time since onset of injury/illness/exacerbation are also affecting patient's functional outcome.   REHAB POTENTIAL: Good minus.  CLINICAL DECISION MAKING: Evolving/moderate complexity  EVALUATION COMPLEXITY: Low   GOALS:  SHORT TERM GOALS: Target date: 09/18/22.  Ind with an HEP. Goal status: INITIAL  2.  Increase right shoulder strength to a solid 5/5 to increase stability for performance of functional activities.  Goal  status: INITIAL  3.  Perform ADL's with left shoulder pain not > 3/10.  Goal status: INITIAL   PLAN:  PT FREQUENCY: 2x/week  PT DURATION: 4 weeks  PLANNED INTERVENTIONS: Therapeutic exercises, Therapeutic activity, Neuromuscular re-education, Patient/Family education, Self Care, and Manual therapy  PLAN FOR NEXT SESSION: RTC strengthening, STW/M.   Denard Tuminello, [place], PT 10/02/2022, 3:09 PM

## 2022-10-09 ENCOUNTER — Ambulatory Visit: Payer: Medicaid Other | Admitting: Physical Therapy
